# Patient Record
Sex: Female | Born: 1995
Health system: Southern US, Community
[De-identification: ages and names within clinical notes are randomized; demographics above are authoritative.]

## PROBLEM LIST (undated history)

## (undated) DIAGNOSIS — F4310 Post-traumatic stress disorder, unspecified: Secondary | ICD-10-CM

## (undated) DIAGNOSIS — K279 Peptic ulcer, site unspecified, unspecified as acute or chronic, without hemorrhage or perforation: Secondary | ICD-10-CM

## (undated) DIAGNOSIS — N83209 Unspecified ovarian cyst, unspecified side: Secondary | ICD-10-CM

## (undated) DIAGNOSIS — K7290 Hepatic failure, unspecified without coma: Secondary | ICD-10-CM

## (undated) DIAGNOSIS — F32A Depression, unspecified: Secondary | ICD-10-CM

---

## 2014-04-15 ENCOUNTER — Emergency Department (HOSPITAL_BASED_OUTPATIENT_CLINIC_OR_DEPARTMENT_OTHER)
Admission: EM | Admit: 2014-04-15 | Discharge: 2014-04-15 | Disposition: A | Discharge status: Home / Self Care | Payer: Self-pay | Attending: Emergency Medicine | Admitting: Emergency Medicine

## 2014-04-15 ENCOUNTER — Encounter (HOSPITAL_BASED_OUTPATIENT_CLINIC_OR_DEPARTMENT_OTHER): Payer: Self-pay | Admitting: *Deleted

## 2014-04-15 DIAGNOSIS — J020 Streptococcal pharyngitis: Secondary | ICD-10-CM | POA: Insufficient documentation

## 2014-04-15 DIAGNOSIS — Z8711 Personal history of peptic ulcer disease: Secondary | ICD-10-CM | POA: Insufficient documentation

## 2014-04-15 HISTORY — DX: Peptic ulcer, site unspecified, unspecified as acute or chronic, without hemorrhage or perforation: K27.9

## 2014-04-15 LAB — RAPID STREP SCREEN (MED CTR MEBANE ONLY): Streptococcus, Group A Screen (Direct): NEGATIVE

## 2014-04-15 MED ORDER — AMOXICILLIN 500 MG PO CAPS: 500.0000 mg | ORAL_CAPSULE | Freq: Three times a day (TID) | ORAL | Status: DC

## 2014-04-15 NOTE — ED Notes (Signed)
C/o sore throat x 1 week.

## 2014-04-15 NOTE — ED Provider Notes (Signed)
CSN: [id number]     Arrival date & time 04/15/14  1856 History   First MD Initiated Contact with Patient 04/15/14 1925     Chief Complaint  Patient presents with  . Sore Throat     (Consider location/radiation/quality/duration/timing/severity/associated sxs/prior Treatment) HPI Comments: Patient is a 18 year old female who presents with a 4 day history of sore throat. Patient reports gradual onset and progressively worsening sharp, severe throat pain. The pain is constant and made worse with swallowing. The pain is localized to the patient's throat and equal on both sides. Nothing alleviates the pain. The patient has not tried anything for symptom relief. Patient reports associated subjective fever, cervical adenopathy, and non productive cough. Patient denies headache, visual changes, sinus congestion, difficulty breathing, chest pain, SOB, abdominal pain, NVD.     Patient is a 18 y.o. female presenting with pharyngitis.  Sore Throat Associated symptoms include a sore throat.    Past Medical History  Diagnosis Date  . Peptic ulcer    History reviewed. No pertinent past surgical history. No family history on file. History  Substance Use Topics  . Smoking status: Never Smoker   . Smokeless tobacco: Never Used  . Alcohol Use: No   OB History    No data available     Review of Systems  HENT: Positive for sore throat.   All other systems reviewed and are negative.     Allergies  Review of patient's allergies indicates no known allergies.  Home Medications   Prior to Admission medications   Not on File   BP 128/53 mmHg  Pulse 83  Temp(Src) 98.6 F (37 C) (Oral)  Resp 18  Ht 5\' 2"  (1.575 m)  Wt 160 lb (72.576 kg)  BMI 29.26 kg/m2  SpO2 100%  LMP 03/29/2014 Physical Exam  Constitutional: She is oriented to person, place, and time. She appears well-developed and well-nourished. No distress.  HENT:  Head: Normocephalic and atraumatic.  Bilateral tonsillar  edema, erythema, and exudate.   Eyes: Conjunctivae are normal. No scleral icterus.  Neck: Normal range of motion.  Cardiovascular: Normal rate and regular rhythm.  Exam reveals no gallop and no friction rub.   No murmur heard. Pulmonary/Chest: Effort normal and breath sounds normal. She has no wheezes. She has no rales. She exhibits no tenderness.  Abdominal: Soft. She exhibits no distension. There is no tenderness. There is no rebound.  Musculoskeletal: Normal range of motion.  Lymphadenopathy:    She has cervical adenopathy.  Neurological: She is alert and oriented to person, place, and time. Coordination normal.  Speech is goal-oriented. Moves limbs without ataxia.   Skin: Skin is warm and dry.  Psychiatric: She has a normal mood and affect. Her behavior is normal.  Nursing note and vitals reviewed.   ED Course  Procedures (including critical care time) Labs Review Labs Reviewed  RAPID STREP SCREEN  CULTURE, GROUP A STREP    Imaging Review No results found.   EKG Interpretation None      MDM   Final diagnoses:  Strep throat    8:37 PM Patient's rapid strep is negative but clinical appearance consistent with strep. Patient will be treated with amoxicillin. Vitals stable and patient afebrile.    Emilia BeckKaitlyn Laquincy Eastridge, PA-C 04/15/14 2040  Arby BarretteMarcy Pfeiffer, MD 04/16/14 [phone]

## 2014-04-15 NOTE — Discharge Instructions (Signed)
Take amoxicillin as directed until gone. Refer to attached documents for more information.  °

## 2014-04-17 LAB — CULTURE, GROUP A STREP

## 2014-12-14 ENCOUNTER — Encounter (HOSPITAL_BASED_OUTPATIENT_CLINIC_OR_DEPARTMENT_OTHER): Payer: Self-pay

## 2014-12-14 ENCOUNTER — Emergency Department (HOSPITAL_BASED_OUTPATIENT_CLINIC_OR_DEPARTMENT_OTHER)
Admission: EM | Admit: 2014-12-14 | Discharge: 2014-12-14 | Disposition: A | Discharge status: Home / Self Care | Payer: Medicaid Other | Attending: Emergency Medicine | Admitting: Emergency Medicine

## 2014-12-14 DIAGNOSIS — R21 Rash and other nonspecific skin eruption: Secondary | ICD-10-CM | POA: Diagnosis present

## 2014-12-14 DIAGNOSIS — L509 Urticaria, unspecified: Secondary | ICD-10-CM | POA: Diagnosis not present

## 2014-12-14 DIAGNOSIS — Z8711 Personal history of peptic ulcer disease: Secondary | ICD-10-CM | POA: Diagnosis not present

## 2014-12-14 DIAGNOSIS — Z792 Long term (current) use of antibiotics: Secondary | ICD-10-CM | POA: Diagnosis not present

## 2014-12-14 DIAGNOSIS — J029 Acute pharyngitis, unspecified: Secondary | ICD-10-CM | POA: Diagnosis not present

## 2014-12-14 LAB — RAPID STREP SCREEN (MED CTR MEBANE ONLY): Streptococcus, Group A Screen (Direct): NEGATIVE

## 2014-12-14 NOTE — ED Notes (Signed)
PA at bedside.

## 2014-12-14 NOTE — ED Notes (Signed)
Diffuse urticarial rash. Seen previously, given medication, no improvement

## 2014-12-14 NOTE — ED Provider Notes (Signed)
CSN: [id number]     Arrival date & time 12/14/14  1727 History   First MD Initiated Contact with Patient 12/14/14 1748     Chief Complaint  Patient presents with  . Rash     (Consider location/radiation/quality/duration/timing/severity/associated sxs/prior Treatment) HPI   Blood pressure 115/62, pulse 76, temperature 98.7 F (37.1 C), temperature source Oral, resp. rate 18, height  (1.575 m), weight 137 lb (62.143 kg), last menstrual period 12/09/2014, SpO2 99 %.  Susan Beasley is a 19 y.o. female complaining of severe diffuse urticarial rash onset 2 days ago significantly improving with Benadryl and Medrol dose pack. Patient was initially seen at [facility], she had a syncopal event in the ED. Patient reports that the rash is improving and she denies shortness of breath, wheezing, nausea, vomiting, lightheaded sensation, new medications, environmental exposures. She is also reporting a severe sore throat without fever and notes that her daughter has recently had strep. She is specifically requesting a strep test.  Past Medical History  Diagnosis Date  . Peptic ulcer    History reviewed. No pertinent past surgical history. No family history on file. History  Substance Use Topics  . Smoking status: Never Smoker   . Smokeless tobacco: Never Used  . Alcohol Use: No   OB History    No data available     Review of Systems  10 systems reviewed and found to be negative, except as noted in the HPI.   Allergies  Review of patient's allergies indicates no known allergies.  Home Medications   Prior to Admission medications   Medication Sig Start Date End Date Taking? Authorizing Provider  amoxicillin (AMOXIL) 500 MG capsule Take 1 capsule (500 mg total) by mouth 3 (three) times daily. 04/15/14   Kaitlyn Szekalski, PA-C   BP 115/62 mmHg  Pulse 76  Temp(Src) 98.7 F (37.1 C) (Oral)  Resp 18  Ht  (1.575 m)  Wt 137 lb (62.143 kg)  BMI 25.05 kg/m2  SpO2 99%   LMP 12/09/2014 Physical Exam  Constitutional: She is oriented to person, place, and time. She appears well-developed and well-nourished. No distress.  HENT:  Head: Normocephalic and atraumatic.  Mouth/Throat: Oropharynx is clear and moist.  No drooling or stridor. Posterior pharynx mildly erythematous no significant tonsillar hypertrophy. No exudate. Soft palate rises symmetrically. No TTP or induration under tongue.   No tenderness to palpation of frontal or bilateral maxillary sinuses.  No mucosal edema in the nares.  Bilateral tympanic membranes with normal architecture and good light reflex.    Eyes: Conjunctivae and EOM are normal. Pupils are equal, round, and reactive to light.  Neck: Normal range of motion.  Cardiovascular: Normal rate, regular rhythm and intact distal pulses.   Pulmonary/Chest: Effort normal and breath sounds normal. No stridor. No respiratory distress. She has no wheezes. She has no rales. She exhibits no tenderness.  Abdominal: Soft. Bowel sounds are normal. She exhibits no distension and no mass. There is no tenderness. There is no rebound and no guarding.  Musculoskeletal: Normal range of motion.  Neurological: She is alert and oriented to person, place, and time.  Skin: No rash noted.  Psychiatric: She has a normal mood and affect.  Nursing note and vitals reviewed.   ED Course  Procedures (including critical care time) Labs Review Labs Reviewed  RAPID STREP SCREEN (NOT AT [facility])  CULTURE, GROUP A STREP    Imaging Review No results found.   EKG Interpretation None  MDM   Final diagnoses:  Hives  Pharyngitis    Filed Vitals:   12/14/14 1734  BP: 115/62  Pulse: 76  Temp: 98.7 F (37.1 C)  TempSrc: Oral  Resp: 18  Height: 5\' 2"  (1.575 m)  Weight: 137 lb (62.143 kg)  SpO2: 99%     Susan Beasley is a pleasant 19 y.o. female presenting with persistent hives. She's been given Medrol Dosepak and has been taking Benadryl with  excellent symptom relief however she states that when she wakes up in the morning she needs to re-dose the medications. I've explained to her that if she is still being exposed to what ever the allergen is that she will need to continue to take medications. I'll give her referral for allergy testing. Patient has no signs or symptoms consistent with strep however, rapid strep test is ordered at patient's request. No signs of secondary organ involvement, no indication for epinephrine.  Rapid strep negative, allergy testing referral given.  Evaluation does not show pathology that would require ongoing emergent intervention or inpatient treatment. Pt is hemodynamically stable and mentating appropriately. Discussed findings and plan with patient/guardian, who agrees with care plan. All questions answered. Return precautions discussed and outpatient follow up given.     Wynetta Emery, PA-C 12/14/14 1851  Richardean Canal, MD 12/14/14 2351

## 2014-12-14 NOTE — Discharge Instructions (Signed)
1 to 2 tablets of 25 mg Benadryl pills every 4-6 hours as needed to a maximum of 300 mg per day. In addition, you may apply a topical hydrocortisone ointment to all affected areas except for the face.   Do not hesitate to call 911 or return to the emergency room if you develop any shortness of breath, wheezing, tongue or lip swelling.  Do not hesitate to return to the emergency room for any new, worsening or concerning symptoms.  Please obtain primary care using resource guide below. Let them know that you were seen in the emergency room and that they will need to obtain records for further outpatient management.   Hives Hives are itchy, red, swollen areas of the skin. They can vary in size and location on your body. Hives can come and go for hours or several days (acute hives) or for several weeks (chronic hives). Hives do not spread from person to person (noncontagious). They may get worse with scratching, exercise, and emotional stress. CAUSES   Allergic reaction to food, additives, or drugs.  Infections, including the common cold.  Illness, such as vasculitis, lupus, or thyroid disease.  Exposure to sunlight, heat, or cold.  Exercise.  Stress.  Contact with chemicals. SYMPTOMS   Red or white swollen patches on the skin. The patches may change size, shape, and location quickly and repeatedly.  Itching.  Swelling of the hands, feet, and face. This may occur if hives develop deeper in the skin. DIAGNOSIS  Your caregiver can usually tell what is wrong by performing a physical exam. Skin or blood tests may also be done to determine the cause of your hives. In some cases, the cause cannot be determined. TREATMENT  Mild cases usually get better with medicines such as antihistamines. Severe cases may require an emergency epinephrine injection. If the cause of your hives is known, treatment includes avoiding that trigger.  HOME CARE INSTRUCTIONS   Avoid causes that trigger your  hives.  Take antihistamines as directed by your caregiver to reduce the severity of your hives. Non-sedating or low-sedating antihistamines are usually recommended. Do not drive while taking an antihistamine.  Take any other medicines prescribed for itching as directed by your caregiver.  Wear loose-fitting clothing.  Keep all follow-up appointments as directed by your caregiver. SEEK MEDICAL CARE IF:   You have persistent or severe itching that is not relieved with medicine.  You have painful or swollen joints. SEEK IMMEDIATE MEDICAL CARE IF:   You have a fever.  Your tongue or lips are swollen.  You have trouble breathing or swallowing.  You feel tightness in the throat or chest.  You have abdominal pain. These problems may be the first sign of a life-threatening allergic reaction. Call your local emergency services (911 in U.S.). MAKE SURE YOU:   Understand these instructions.  Will watch your condition.  Will get help right away if you are not doing well or get worse. Document Released: 04/23/2005 Document Revised: 04/28/2013 Document Reviewed: 07/17/2011 [facility] Patient Information 2015 [place], [place]. This information is not intended to replace advice given to you by your health care provider. Make sure you discuss any questions you have with your health care provider.   Emergency Department Resource Guide 1) Find a Doctor and Pay Out of Pocket Although you won't have to find out who is covered by your insurance plan, it is a good idea to ask around and get recommendations. You will then need to call the office and  see if the doctor you have chosen will accept you as a new patient and what types of options they offer for patients who are self-pay. Some doctors offer discounts or will set up payment plans for their patients who do not have insurance, but you will need to ask so you aren't surprised when you get to your appointment.  2) Contact Your Local Health  Department Not all health departments have doctors that can see patients for sick visits, but many do, so it is worth a call to see if yours does. If you don't know where your local health department is, you can check in your phone book. The CDC also has a tool to help you locate your state's health department, and many state websites also have listings of all of their local health departments.  3) Find a Walk-in Clinic If your illness is not likely to be very severe or complicated, you may want to try a walk in clinic. These are popping up all over the country in pharmacies, drugstores, and shopping centers. They're usually staffed by nurse practitioners or physician assistants that have been trained to treat common illnesses and complaints. They're usually fairly quick and inexpensive. However, if you have serious medical issues or chronic medical problems, these are probably not your best option.  No Primary Care Doctor: - Call Health Connect at  [phone] - they can help you locate a primary care doctor that  accepts your insurance, provides certain services, etc. - Physician Referral Service- [phone]  Chronic Pain Problems: Organization         Address  Phone   Notes  [facility]  [phone] Patients need to be referred by their primary care doctor.   Medication Assistance: Organization         Address  Phone   Notes  [facility] Medication [facility] [address] [place], [place] [address] [phone] --Must be a resident of [facility] -- Must have NO insurance coverage whatsoever (no Medicaid/ Medicare, etc.) -- The pt. MUST have a primary care doctor that directs their care regularly and follows them in the community   [facility]ist  [phone]   [employer]  [phone]    Agencies that provide inexpensive medical care: Organization         Address  Phone   Notes  [facility]  [phone]   [facility]    [phone]   [facility] [address] [place], [place] [address] [phone]   [facility] [address] [place], [place] [phone]   Planned Parenthood    [phone]   [facility]    [phone]   [facility]  [address], [place] Phone:  [phone], Fax:  [phone] Hours of Operation:  9 am - 6 pm, M-F.  Also accepts Medicaid/Medicare and self-pay.  [facility]  [address], [place] Phone: [phone], Fax: [phone]. Hours of Operation:  8:30 am - 5:30 pm, M-F.  Also accepts Medicaid and self-pay.  [facility] [place] [address], [place] Phone: [phone]   [facility] [address] Natasha Bence [place], [place] [phone], Ext. [phone] Mondays & Thursdays: 7-9 AM.  First 15 patients are seen on a first come, first serve basis.    Medicaid-accepting [facility]  Providers:  Organization         Address  Phone   Notes  [facility] [address], [place] [phone] Also accepts self-pay patients.  [facility] [address] Laurell Josephs [place], [place]  [phone]   [facility] [address], [place] [phone]   [facility] [address], [place] [phone]   Renaye Rakers [address], [place]   [phone] Only accepts [place] Access [place] patients after they have their name applied to their card.   Self-Pay (no insurance) in [facility]:  Organization         Address  Phone   Notes  Sickle Cell Patients, [facility] [address] [place], [place] [phone]   [facility] [address] [place], [place] [phone]   [facility] [place]  [address],  [place] [phone]   [facility]/Dr. Osei-Bonsu  [address] [place] or [address], [place] [phone] Phone number for both [place] and [place] locations is the same.  [facility] [address], [place] [phone]   [facility] [address], [place] or [address] [phone]   [facility] [address], [place] [phone], phone; [phone], fax Sees patients 1st and 3rd Saturday of every month.  Must not qualify for public or private insurance (i.e. Medicaid, Medicare, [place] Health Choice, Veterans' Benefits)  Household income should be no more than 200% of the poverty level The clinic cannot treat you if you are pregnant or think you are pregnant  Sexually transmitted diseases are not treated at the clinic.    Dental Care: Organization         Address  Phone  Notes  [facility] Department of [facility] [address] [place], [place] [phone] Accepts children up to age 72 who are enrolled in [place] or [place] Health Choice; pregnant women with a Medicaid card; and children who have applied for Medicaid or [place] Health Choice, but were declined, whose parents can pay a reduced fee at time of service.  [facility] Department of [facility]  [address], [place] [phone] Accepts children up to age 15 who are enrolled in [place] or [place] Health Choice; pregnant women with a Medicaid card; and children who have applied for Medicaid or [place] Health Choice, but were declined, whose parents can pay a reduced fee at time of service.  [facility]  [address] [place], [place] [phone] Patients are seen by appointment only. Walk-ins are not accepted. [facility] will see patients 9 years of age and older. Monday - Tuesday (8am-5pm) Most Wednesdays  (8:30-5pm) $30 per visit, cash only  [facility]  [address], [facility] [phone] Patients are seen by appointment only. Walk-ins are not accepted. [facility] will see patients 3 years of age and older. One Wednesday Evening (Monthly: Volunteer Based).  $30 per visit, cash only  [employer]  [phone] for adults; Children under age 81, call Graduate Pediatric Dentistry at [phone]. Children aged 8-14, please call [phone] to request a pediatric application.  Dental services are provided in all areas of dental care including fillings, crowns and bridges, complete and partial dentures, implants, gum treatment, root canals, and extractions. Preventive care is also provided. Treatment is provided to both adults and children. Patients are selected via a lottery and there is often a waiting list.   [facility] [address] [place]  [phone] [email or web address]   [facility] [address] [place], [place] [phone], Ext. [phone] Second and Fourth Thursday of each month, opens at 6:30 AM; Clinic ends at 9 AM.  Patients are seen on a first-come first-served basis, and a limited number are seen during each clinic.   [facility]  [address] Ether Griffins [place], [place] [phone]   Eligibility Requirements You must have lived in [place], [place], or [place] counties for at least the last three months.   You cannot be eligible for state or federal sponsored [place], including [employer], [place], or [employer].   You generally cannot be eligible for healthcare insurance through your employer.    How to apply: Eligibility screenings are held every Tuesday and Wednesday afternoon from 1:00 pm until 4:00 pm. You do not need an appointment for the interview!  [facility] [address], [place], [place] [phone]   [facility]  Health Department  [phone]   [facility] Health Department  [phone]   [facility] Health Department  [phone]    Behavioral Health Resources in the Community: Intensive Outpatient Programs Organization         Address  Phone  Notes  [facility] [address], [place], [place] [phone]   [facility] [address] [place], [place] [phone]   [facility] [address] [place], [place]  [phone]   [facility] [address]  [place], [place] [phone] or [phone]   Substance Abuse Resources Organization         Address  Phone  Notes  [facility]  [phone]   [facility]  [phone]   [facility] [place]  [phone]   Floydene Flock  [phone]   Residential & Outpatient Substance Abuse Program  [phone]   Psychological Services Organization         Address  Phone  Notes  [facility]  [phone]   [facility]  [phone]   [facility] [address] [place] [phone] or [phone]    Mobile Crisis Teams Organization         Address  Phone  Notes  [facility], Mobile Crisis Care Unit  [phone]   [facility]  [address] [place], [place] [phone]   Doristine Locks [address] [place] [phone]    Self-Help/Support Groups Organization         Address  Phone             Notes  [facility] [place] - variety of support groups  336- I7437963 Call for more information  Narcotics Anonymous (NA), [facility] [address], [employer] [place]  2 meetings at this location   Statistician         Address  Phone  Notes  [facility] [address],    [place]  [phone]   [facility]  [address] [place] [address], [place], [place]  [phone]   [facility] [address] [place], [place] [phone] Admissions: 8am-3pm M-F  [facility] [address]    [place], [place] [phone]   [facility] [address] [place], [place], [place] [phone]   [facility] [address],  [place], [place] [phone]   [facility] - Intensive Outpatient [address] Laurell Joseph[address], [place], [place] [phone]   [facility] ([facility] [address]  [place], [place] [phone] or [phone]   [facility] ([facility]) [address] [place], [place] [phone] Accepts Medicaid  [facility] [address]  [place] [phone] Substance Abuse/Addiction Treatment   [facility] Organization         Address  Phone  Notes  [facility]  [phone]   Angie Fava, PhD [address] Ervin Knack [place], [place]   [phone] or [phone]   [facility] Behavioral   [address] [place], [place] [phone]   [facility] [address], [place], [place] [phone] Insurance/Medicaid/sponsorship through [facility] and Families [address]                                    [place], [place] [phone] Therapy/tele-psych/case  [facility] [address], [place] [phone]    Dr. Lolly Mustache  [phone]   [facility]  [employer] [facility] Dept. 1) [address] [place] 2) [address] [place] 3)  [address], [place] [phone]  [phone]   [facility] Child Abuse Hotline 845-289-5611 or [phone] (After Hours)

## 2014-12-16 LAB — CULTURE, GROUP A STREP: STREP A CULTURE: NEGATIVE

## 2015-01-21 ENCOUNTER — Encounter (HOSPITAL_COMMUNITY): Payer: Self-pay

## 2015-01-21 ENCOUNTER — Emergency Department (HOSPITAL_COMMUNITY)
Admission: EM | Admit: 2015-01-21 | Discharge: 2015-01-21 | Disposition: A | Discharge status: Home / Self Care | Payer: Medicaid Other | Attending: Emergency Medicine | Admitting: Emergency Medicine

## 2015-01-21 DIAGNOSIS — S0993XA Unspecified injury of face, initial encounter: Secondary | ICD-10-CM | POA: Diagnosis present

## 2015-01-21 DIAGNOSIS — T07XXXA Unspecified multiple injuries, initial encounter: Secondary | ICD-10-CM

## 2015-01-21 DIAGNOSIS — Z8711 Personal history of peptic ulcer disease: Secondary | ICD-10-CM | POA: Diagnosis not present

## 2015-01-21 DIAGNOSIS — S0001XA Abrasion of scalp, initial encounter: Secondary | ICD-10-CM | POA: Insufficient documentation

## 2015-01-21 DIAGNOSIS — Y9289 Other specified places as the place of occurrence of the external cause: Secondary | ICD-10-CM | POA: Diagnosis not present

## 2015-01-21 DIAGNOSIS — Y9389 Activity, other specified: Secondary | ICD-10-CM | POA: Diagnosis not present

## 2015-01-21 DIAGNOSIS — Y998 Other external cause status: Secondary | ICD-10-CM | POA: Diagnosis not present

## 2015-01-21 MED ORDER — BACITRACIN ZINC 500 UNIT/GM EX OINT: TOPICAL_OINTMENT | Freq: Two times a day (BID) | CUTANEOUS | Status: DC
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
## 2015-01-21 NOTE — Discharge Instructions (Signed)
Abrasions An abrasion is a cut or scrape of the skin. Abrasions do not go through all layers of the skin. HOME CARE  If a bandage (dressing) was put on your wound, change it as told by your doctor. If the bandage sticks, soak it off with warm.  Wash the area with water and soap 2 times a day. Rinse off the soap. Pat the area dry with a clean towel.  Put on medicated cream (ointment) as told by your doctor.  Change your bandage right away if it gets wet or dirty.  Only take medicine as told by your doctor.  See your doctor within 24-48 hours to get your wound checked.  Check your wound for redness, puffiness (swelling), or yellowish-white fluid (pus). GET HELP RIGHT AWAY IF:   You have more pain in the wound.  You have redness, swelling, or tenderness around the wound.  You have pus coming from the wound.  You have a fever or lasting symptoms for more than 2-3 days.  You have a fever and your symptoms suddenly get worse.  You have a bad smell coming from the wound or bandage. MAKE SURE YOU:   Understand these instructions.  Will watch your condition.  Will get help right away if you are not doing well or get worse. Document Released: 10/10/2007 Document Revised: 01/16/2012 Document Reviewed: 03/27/2011 [facility] Patient Information 2015 [place], [place]. This information is not intended to replace advice given to you by your health care provider. Make sure you discuss any questions you have with your health care provider. Apply a small amount of antibiotic-coated ointment to the abrasions until scab is formed

## 2015-12-30 ENCOUNTER — Encounter (HOSPITAL_BASED_OUTPATIENT_CLINIC_OR_DEPARTMENT_OTHER): Payer: Self-pay | Admitting: Emergency Medicine

## 2015-12-30 DIAGNOSIS — Z5321 Procedure and treatment not carried out due to patient leaving prior to being seen by health care provider: Secondary | ICD-10-CM | POA: Insufficient documentation

## 2015-12-30 DIAGNOSIS — R51 Headache: Secondary | ICD-10-CM | POA: Insufficient documentation

## 2015-12-30 DIAGNOSIS — Z792 Long term (current) use of antibiotics: Secondary | ICD-10-CM | POA: Insufficient documentation

## 2015-12-30 NOTE — ED Triage Notes (Signed)
Patient states that for that last month she has had a Headache behind her eyes and to the back of her head. The patient states that she came in tonight because it is now "going down into my chest" Denies any Nausea or Vomiting.

## 2015-12-31 ENCOUNTER — Emergency Department (HOSPITAL_BASED_OUTPATIENT_CLINIC_OR_DEPARTMENT_OTHER)
Admission: EM | Admit: 2015-12-31 | Discharge: 2015-12-31 | Disposition: A | Discharge status: Home / Self Care | Payer: Medicaid Other | Attending: Dermatology | Admitting: Dermatology

## 2015-12-31 NOTE — ED Notes (Signed)
Per registration pt left

## 2016-01-12 ENCOUNTER — Emergency Department (HOSPITAL_BASED_OUTPATIENT_CLINIC_OR_DEPARTMENT_OTHER)
Admission: EM | Admit: 2016-01-12 | Discharge: 2016-01-13 | Disposition: A | Discharge status: Home / Self Care | Payer: Self-pay | Attending: Emergency Medicine | Admitting: Emergency Medicine

## 2016-01-12 ENCOUNTER — Emergency Department (HOSPITAL_BASED_OUTPATIENT_CLINIC_OR_DEPARTMENT_OTHER): Payer: Self-pay

## 2016-01-12 ENCOUNTER — Encounter (HOSPITAL_BASED_OUTPATIENT_CLINIC_OR_DEPARTMENT_OTHER): Payer: Self-pay

## 2016-01-12 DIAGNOSIS — R509 Fever, unspecified: Secondary | ICD-10-CM | POA: Insufficient documentation

## 2016-01-12 DIAGNOSIS — R51 Headache: Secondary | ICD-10-CM | POA: Insufficient documentation

## 2016-01-12 DIAGNOSIS — M542 Cervicalgia: Secondary | ICD-10-CM | POA: Insufficient documentation

## 2016-01-12 DIAGNOSIS — G8929 Other chronic pain: Secondary | ICD-10-CM

## 2016-01-12 HISTORY — DX: Unspecified ovarian cyst, unspecified side: N83.209

## 2016-01-12 MED ORDER — DIPHENHYDRAMINE HCL 50 MG/ML IJ SOLN
25.0000 mg | Freq: Once | INTRAMUSCULAR | Status: AC
  Administered 2016-01-12: 25 mg via INTRAVENOUS
  Filled 2016-01-12: qty 1

## 2016-01-12 MED ORDER — SODIUM CHLORIDE 0.9 % IV BOLUS (SEPSIS)
1000.0000 mL | Freq: Once | INTRAVENOUS | Status: AC
  Administered 2016-01-12: 1000 mL via INTRAVENOUS

## 2016-01-12 MED ORDER — PROCHLORPERAZINE EDISYLATE 5 MG/ML IJ SOLN
10.0000 mg | Freq: Once | INTRAMUSCULAR | Status: AC
  Administered 2016-01-12: 10 mg via INTRAVENOUS
  Filled 2016-01-12: qty 2

## 2016-01-12 NOTE — ED Triage Notes (Addendum)
Pt c/o intermittent head pain with photosensitivity for the last month.  She has associated neck and shoulder pain with the head pain.  Denies n/v, states she had a fever two weeks ago.  Has not tried any medication recently for pain.

## 2016-01-12 NOTE — ED Provider Notes (Signed)
[facility] Provider Note   CSN: [id number] Arrival date & time: 01/12/16  2310   By signing my name below, I, [name], attest that this documentation has been prepared under the direction and in the presence of Shon Baton, MD . Electronically Signed: [name], Scribe. 01/12/2016. 11:51 PM.  History   Chief Complaint Chief Complaint  Patient presents with  . Headache   The history is provided by the patient and medical records. No language interpreter was used.    HPI Comments:  Harmoney Beasley is a 20 y.o. female who presents to the Emergency Department complaining of constant headaches which began at the start of August.  She woke up one morning with a "crick" in her neck that didn't go away.  This progressed to chills, cold symptoms, a fever of 101, a headache, and neck pain.  She reports isolated fever 3 days after the start of her headache. She has not had any subsequent fevers. She reports headache begins at the top of her head and radiates to her neck and shoulders.  Her pain is always present, but waxing and waning--tonight she describes it as 10/10.  She has no history of severe headache and has never had a headache like this before.  She is sensitive to light and states that her pain is exacerbated by standing up quickly and bending over stating it "feels like everything is going to fall out of her head."  She adds that her neck pain is worse when she turns her head, but denies stiffness.  She denies numbness, tingling, vision changes, sore throat, and vomiting.  She also denies taking estrogen, birth control, and regular medications.  She has no known allergies.     Past Medical History:  Diagnosis Date  . Ovarian cyst   . Peptic ulcer     There are no active problems to display for this patient.   History reviewed. No pertinent surgical history.  OB History    No data available       Home Medications    Prior to Admission  medications   Medication Sig Start Date End Date Taking? Authorizing Provider  amoxicillin (AMOXIL) 500 MG capsule Take 1 capsule (500 mg total) by mouth 3 (three) times daily. Patient not taking: Reported on 01/21/2015 04/15/14   Emilia Beck, PA-C  cyclobenzaprine (FLEXERIL) 10 MG tablet Take 1 tablet (10 mg total) by mouth 2 (two) times daily as needed for muscle spasms. 01/13/16   Shon Baton, MD    Family History No family history on file.  Social History Social History  Substance Use Topics  . Smoking status: Never Smoker  . Smokeless tobacco: Never Used  . Alcohol use No     Allergies   Review of patient's allergies indicates no known allergies.   Review of Systems Review of Systems  Constitutional: Positive for chills and fever.  HENT: Negative for sore throat.   Eyes: Negative for visual disturbance.  Gastrointestinal: Negative for vomiting.  Musculoskeletal: Positive for neck pain.  Neurological: Positive for headaches. Negative for dizziness, weakness and numbness.  All other systems reviewed and are negative.    Physical Exam Updated Vital Signs BP 111/65 (BP Location: Left Arm)   Pulse 87   Temp 98.3 F (36.8 C) (Oral)   Resp 18   Ht 5\' 2"  (1.575 m)   Wt 130 lb (59 kg)   LMP 01/07/2016   SpO2 100%   BMI 23.78 kg/m  Physical Exam  Constitutional: She is oriented to person, place, and time. She appears well-developed and well-nourished. No distress.  HENT:  Head: Normocephalic and atraumatic.  Eyes: EOM are normal. Pupils are equal, round, and reactive to light.  Neck: Normal range of motion. Neck supple.  No meningismus, tenderness to palpation bilateral cervical paraspinous muscle region and trapezius muscles  Cardiovascular: Normal rate, regular rhythm and normal heart sounds.   Pulmonary/Chest: Effort normal and breath sounds normal. No respiratory distress. She has no wheezes.  Abdominal: Soft. Bowel sounds are normal.  Neurological:  She is alert and oriented to person, place, and time.  Cranial nerves II through XII intact, 5 out of 5 strength in all 4 extremities, no dysmetria to finger-nose-finger  Skin: Skin is warm and dry.  Psychiatric: She has a normal mood and affect.  Nursing note and vitals reviewed.    ED Treatments / Results   DIAGNOSTIC STUDIES:  Oxygen Saturation is 100% on RA, NML by my interpretation.    COORDINATION OF CARE:  11:52 PM Discussed treatment plan with pt at bedside and pt agreed to plan.  Labs (all labs ordered are listed, but only abnormal results are displayed) Labs Reviewed - No data to display  EKG  EKG Interpretation None       Radiology Ct Head Wo Contrast  Result Date: 01/13/2016 CLINICAL DATA:  Headache and photosensitivity EXAM: CT HEAD WITHOUT CONTRAST TECHNIQUE: Contiguous axial images were obtained from the base of the skull through the vertex without intravenous contrast. COMPARISON:  None. FINDINGS: Brain: No mass lesion, intraparenchymal hemorrhage or extra-axial collection. No evidence of acute cortical infarct. Brain parenchyma and CSF-containing spaces are normal for age. There is no expansion or abnormal hyperdensity of the visible dural venous sinuses. No colloid cyst is identified. Normal position of the cerebellar tonsils. Vascular: No hyperdense vessel or atherosclerotic calcification. Skull: Normal visualized skull base, calvarium and extracranial soft tissues. Sinuses/Orbits: No sinus fluid levels or advanced mucosal thickening. No mastoid effusion. Normal orbits. IMPRESSION: Normal head CT. Electronically Signed   By: Deatra Robinson M.D.   On: 01/13/2016 00:17    Procedures Procedures (including critical care time)  Medications Ordered in ED Medications  sodium chloride 0.9 % bolus 1,000 mL (1,000 mLs Intravenous New Bag/Given 01/12/16 2357)  prochlorperazine (COMPAZINE) injection 10 mg (10 mg Intravenous Given 01/12/16 2358)  diphenhydrAMINE (BENADRYL)  injection 25 mg (25 mg Intravenous Given 01/12/16 2357)  ketorolac (TORADOL) 30 MG/ML injection 15 mg (15 mg Intravenous Given 01/13/16 0030)     Initial Impression / Assessment and Plan / ED Course  I have reviewed the triage vital signs and the nursing notes.  Pertinent labs & imaging results that were available during my care of the patient were reviewed by me and considered in my medical decision making (see chart for details).  Clinical Course    Patient presents with a headache.  Has been chronic over last month. Reports early on that she had a fever but has not had a fever in many weeks. Also reports some neck pain but no stiffness. She is afebrile. Nontoxic. Nonfocal neurologic exam. She does report that the headache is worse with bending over which can be a concerning feature. No meningismus on exam. Patient was given a migraine cocktail. CT head negative for mass lesion. She also has reproducible tenderness over the muscles of her cervical and upper back. On recheck, patient reports improvement of her headache. Will discharge her Flexeril and have her follow-up  with neurology if the headaches persist.  After history, exam, and medical workup I feel the patient has been appropriately medically screened and is safe for discharge home. Pertinent diagnoses were discussed with the patient. Patient was given return precautions.   Final Clinical Impressions(s) / ED Diagnoses   Final diagnoses:  Chronic nonintractable headache, unspecified headache type  Muscle pain, cervical    New Prescriptions New Prescriptions   CYCLOBENZAPRINE (FLEXERIL) 10 MG TABLET    Take 1 tablet (10 mg total) by mouth 2 (two) times daily as needed for muscle spasms.   I personally performed the services described in this documentation, which was scribed in my presence. The recorded information has been reviewed and is accurate.     Shon Batonourtney F Horton, MD 01/13/16 [phone]

## 2016-01-13 MED ORDER — KETOROLAC TROMETHAMINE 30 MG/ML IJ SOLN
15.0000 mg | Freq: Once | INTRAMUSCULAR | Status: AC
  Administered 2016-01-13: 15 mg via INTRAVENOUS
  Filled 2016-01-13: qty 1

## 2016-01-13 MED ORDER — CYCLOBENZAPRINE HCL 10 MG PO TABS: 10.0000 mg | ORAL_TABLET | Freq: Two times a day (BID) | ORAL | 0 refills | Status: DC | PRN

## 2017-03-21 ENCOUNTER — Other Ambulatory Visit: Payer: Self-pay

## 2017-03-21 ENCOUNTER — Encounter (HOSPITAL_BASED_OUTPATIENT_CLINIC_OR_DEPARTMENT_OTHER): Payer: Self-pay | Admitting: Emergency Medicine

## 2017-03-21 ENCOUNTER — Emergency Department (HOSPITAL_BASED_OUTPATIENT_CLINIC_OR_DEPARTMENT_OTHER)
Admission: EM | Admit: 2017-03-21 | Discharge: 2017-03-21 | Disposition: A | Discharge status: Home / Self Care | Payer: Self-pay | Attending: Emergency Medicine | Admitting: Emergency Medicine

## 2017-03-21 DIAGNOSIS — K0889 Other specified disorders of teeth and supporting structures: Secondary | ICD-10-CM | POA: Insufficient documentation

## 2017-03-21 DIAGNOSIS — Z5321 Procedure and treatment not carried out due to patient leaving prior to being seen by health care provider: Secondary | ICD-10-CM | POA: Insufficient documentation

## 2017-03-21 NOTE — ED Triage Notes (Signed)
Patient states that she has a broken tooth at the back of her mouth. She reports that she feels like it is infected now

## 2017-03-21 NOTE — ED Notes (Signed)
No answer

## 2017-07-07 ENCOUNTER — Other Ambulatory Visit: Payer: Self-pay

## 2017-07-07 ENCOUNTER — Emergency Department (HOSPITAL_BASED_OUTPATIENT_CLINIC_OR_DEPARTMENT_OTHER): Payer: Self-pay

## 2017-07-07 ENCOUNTER — Inpatient Hospital Stay (HOSPITAL_BASED_OUTPATIENT_CLINIC_OR_DEPARTMENT_OTHER)
Admission: EM | Admit: 2017-07-07 | Discharge: 2017-07-10 | DRG: 918 | Disposition: A | Discharge status: Home / Self Care | Payer: Self-pay | Attending: Family Medicine | Admitting: Family Medicine

## 2017-07-07 ENCOUNTER — Encounter (HOSPITAL_BASED_OUTPATIENT_CLINIC_OR_DEPARTMENT_OTHER): Payer: Self-pay | Admitting: Adult Health

## 2017-07-07 DIAGNOSIS — R945 Abnormal results of liver function studies: Secondary | ICD-10-CM

## 2017-07-07 DIAGNOSIS — E876 Hypokalemia: Secondary | ICD-10-CM | POA: Diagnosis present

## 2017-07-07 DIAGNOSIS — K047 Periapical abscess without sinus: Secondary | ICD-10-CM

## 2017-07-07 DIAGNOSIS — T391X1A Poisoning by 4-Aminophenol derivatives, accidental (unintentional), initial encounter: Principal | ICD-10-CM

## 2017-07-07 DIAGNOSIS — R1011 Right upper quadrant pain: Secondary | ICD-10-CM

## 2017-07-07 DIAGNOSIS — R740 Nonspecific elevation of levels of transaminase and lactic acid dehydrogenase [LDH]: Secondary | ICD-10-CM | POA: Diagnosis present

## 2017-07-07 DIAGNOSIS — D72829 Elevated white blood cell count, unspecified: Secondary | ICD-10-CM | POA: Diagnosis present

## 2017-07-07 DIAGNOSIS — E872 Acidosis: Secondary | ICD-10-CM | POA: Diagnosis present

## 2017-07-07 DIAGNOSIS — R112 Nausea with vomiting, unspecified: Secondary | ICD-10-CM | POA: Diagnosis present

## 2017-07-07 DIAGNOSIS — R7989 Other specified abnormal findings of blood chemistry: Secondary | ICD-10-CM

## 2017-07-07 DIAGNOSIS — K5090 Crohn's disease, unspecified, without complications: Secondary | ICD-10-CM | POA: Diagnosis present

## 2017-07-07 DIAGNOSIS — R8290 Unspecified abnormal findings in urine: Secondary | ICD-10-CM | POA: Diagnosis present

## 2017-07-07 DIAGNOSIS — K279 Peptic ulcer, site unspecified, unspecified as acute or chronic, without hemorrhage or perforation: Secondary | ICD-10-CM | POA: Diagnosis present

## 2017-07-07 DIAGNOSIS — T50901A Poisoning by unspecified drugs, medicaments and biological substances, accidental (unintentional), initial encounter: Secondary | ICD-10-CM | POA: Diagnosis present

## 2017-07-07 DIAGNOSIS — R9431 Abnormal electrocardiogram [ECG] [EKG]: Secondary | ICD-10-CM | POA: Diagnosis present

## 2017-07-07 LAB — COMPREHENSIVE METABOLIC PANEL
ALT: 188 U/L — ABNORMAL HIGH (ref 14–54)
AST: 153 U/L — ABNORMAL HIGH (ref 15–41)
Albumin: 5.1 g/dL — ABNORMAL HIGH (ref 3.5–5.0)
Alkaline Phosphatase: 67 U/L (ref 38–126)
Anion gap: 14 (ref 5–15)
BUN: 20 mg/dL (ref 6–20)
CALCIUM: 9.8 mg/dL (ref 8.9–10.3)
CO2: 20 mmol/L — ABNORMAL LOW (ref 22–32)
Chloride: 105 mmol/L (ref 101–111)
Creatinine, Ser: 0.77 mg/dL (ref 0.44–1.00)
GFR calc Af Amer: >60 mL/min (ref >60)
GFR calc non Af Amer: >60 mL/min (ref >60)
Glucose, Bld: 119 mg/dL — ABNORMAL HIGH (ref 65–99)
POTASSIUM: 3.7 mmol/L (ref 3.5–5.1)
Sodium: 139 mmol/L (ref 135–145)
Total Bilirubin: 2.3 mg/dL — ABNORMAL HIGH (ref 0.3–1.2)
Total Protein: 8.7 g/dL — ABNORMAL HIGH (ref 6.5–8.1)

## 2017-07-07 LAB — URINALYSIS, ROUTINE W REFLEX MICROSCOPIC
Glucose, UA: NEGATIVE mg/dL
Hgb urine dipstick: NEGATIVE
LEUKOCYTES UA: NEGATIVE
Nitrite: NEGATIVE
PROTEIN: 30 mg/dL — AB
Specific Gravity, Urine: 1.025 (ref 1.005–1.030)
pH: 6 (ref 5.0–8.0)

## 2017-07-07 LAB — APTT: aPTT: 31 seconds (ref 24–36)

## 2017-07-07 LAB — LIPASE, BLOOD: Lipase: 21 U/L (ref 11–51)

## 2017-07-07 LAB — ACETAMINOPHEN LEVEL: Acetaminophen (Tylenol), Serum: <10 ug/mL — ABNORMAL LOW (ref 10–30)

## 2017-07-07 LAB — CBC
HEMATOCRIT: 43.7 % (ref 36.0–46.0)
Hemoglobin: 15.5 g/dL — ABNORMAL HIGH (ref 12.0–15.0)
MCH: 32 pg (ref 26.0–34.0)
MCHC: 35.5 g/dL (ref 30.0–36.0)
MCV: 90.1 fL (ref 78.0–100.0)
Platelets: 251 10*3/uL (ref 150–400)
RBC: 4.85 MIL/uL (ref 3.87–5.11)
RDW: 11.6 % (ref 11.5–15.5)
WBC: 16.9 10*3/uL — AB (ref 4.0–10.5)

## 2017-07-07 LAB — URINALYSIS, MICROSCOPIC (REFLEX)

## 2017-07-07 LAB — PROTIME-INR
INR: 1.58
Prothrombin Time: 18.7 seconds — ABNORMAL HIGH (ref 11.4–15.2)

## 2017-07-07 LAB — PREGNANCY, URINE: Preg Test, Ur: NEGATIVE

## 2017-07-07 LAB — SALICYLATE LEVEL: Salicylate Lvl: <7 mg/dL (ref 2.8–30.0)

## 2017-07-07 MED ORDER — ONDANSETRON HCL 4 MG/2ML IJ SOLN
INTRAMUSCULAR | Status: AC
  Filled 2017-07-07: qty 2

## 2017-07-07 MED ORDER — DEXTROSE 5 % IV SOLN: 15.0000 mg/kg/h | INTRAVENOUS | Status: DC

## 2017-07-07 MED ORDER — SODIUM CHLORIDE 0.9 % IV BOLUS (SEPSIS)
1000.0000 mL | Freq: Once | INTRAVENOUS | Status: AC
  Administered 2017-07-07: 1000 mL via INTRAVENOUS

## 2017-07-07 MED ORDER — INFLUENZA VAC SPLIT QUAD 0.5 ML IM SUSY
0.5000 mL | PREFILLED_SYRINGE | INTRAMUSCULAR | Status: DC
  Filled 2017-07-07: qty 0.5

## 2017-07-07 MED ORDER — PANTOPRAZOLE SODIUM 40 MG IV SOLR
40.0000 mg | Freq: Every day | INTRAVENOUS | Status: DC
  Administered 2017-07-08: 40 mg via INTRAVENOUS
  Filled 2017-07-07: qty 40

## 2017-07-07 MED ORDER — DEXTROSE-NACL 5-0.9 % IV SOLN
INTRAVENOUS | Status: DC
  Administered 2017-07-07 (×2): via INTRAVENOUS

## 2017-07-07 MED ORDER — PROMETHAZINE HCL 25 MG/ML IJ SOLN
12.5000 mg | Freq: Four times a day (QID) | INTRAMUSCULAR | Status: DC | PRN
  Administered 2017-07-07 – 2017-07-08 (×2): 12.5 mg via INTRAVENOUS
  Filled 2017-07-07: qty 1

## 2017-07-07 MED ORDER — ACETYLCYSTEINE LOAD VIA INFUSION: 150.0000 mg/kg | Freq: Once | INTRAVENOUS | Status: DC

## 2017-07-07 MED ORDER — ACETYLCYSTEINE 200 MG/ML IV SOLN
INTRAVENOUS | Status: AC
Start: 2017-07-07 — End: 2017-07-08
  Filled 2017-07-07: qty 30

## 2017-07-07 MED ORDER — PROCHLORPERAZINE EDISYLATE 5 MG/ML IJ SOLN
10.0000 mg | Freq: Four times a day (QID) | INTRAMUSCULAR | Status: DC | PRN
  Administered 2017-07-07: 10 mg via INTRAVENOUS
  Filled 2017-07-07: qty 2

## 2017-07-07 MED ORDER — ACETYLCYSTEINE 200 MG/ML IV SOLN
15.0000 mg/kg/h | INTRAVENOUS | Status: DC
Start: 2017-07-07 — End: 2017-07-08
  Administered 2017-07-07: 15 mg/kg/h via INTRAVENOUS
  Filled 2017-07-07 (×2): qty 100

## 2017-07-07 MED ORDER — ONDANSETRON HCL 4 MG/2ML IJ SOLN: 4.0000 mg | Freq: Once | INTRAMUSCULAR | Status: DC

## 2017-07-07 MED ORDER — ACETYLCYSTEINE LOAD VIA INFUSION
150.0000 mg/kg | Freq: Once | INTRAVENOUS | Status: AC
  Administered 2017-07-07: 8160 mg via INTRAVENOUS
  Filled 2017-07-07: qty 204

## 2017-07-07 MED ORDER — PROMETHAZINE HCL 25 MG/ML IJ SOLN
6.2500 mg | Freq: Four times a day (QID) | INTRAMUSCULAR | Status: DC | PRN
  Filled 2017-07-07 (×2): qty 1

## 2017-07-07 MED ORDER — ONDANSETRON HCL 4 MG/2ML IJ SOLN
4.0000 mg | Freq: Once | INTRAMUSCULAR | Status: AC | PRN
  Administered 2017-07-07: 4 mg via INTRAVENOUS
  Filled 2017-07-07: qty 2

## 2017-07-07 MED ORDER — MORPHINE SULFATE (PF) 4 MG/ML IV SOLN: 1.0000 mg | INTRAVENOUS | Status: DC | PRN

## 2017-07-07 MED ORDER — DEXTROSE 5 % IV SOLN
15.0000 mg/kg/h | INTRAVENOUS | Status: DC
  Filled 2017-07-07: qty 150

## 2017-07-07 MED ORDER — METOCLOPRAMIDE HCL 5 MG/ML IJ SOLN: 10.0000 mg | Freq: Four times a day (QID) | INTRAMUSCULAR | Status: DC | PRN

## 2017-07-07 MED ORDER — PANTOPRAZOLE SODIUM 40 MG IV SOLR
40.0000 mg | Freq: Once | INTRAVENOUS | Status: AC
  Administered 2017-07-07: 40 mg via INTRAVENOUS
  Filled 2017-07-07: qty 40

## 2017-07-07 MED ORDER — ACETYLCYSTEINE 200 MG/ML IV SOLN
INTRAVENOUS | Status: AC
  Filled 2017-07-07: qty 30

## 2017-07-07 MED ORDER — ONDANSETRON HCL 4 MG/2ML IJ SOLN
4.0000 mg | Freq: Four times a day (QID) | INTRAMUSCULAR | Status: DC | PRN
  Administered 2017-07-07: 4 mg via INTRAVENOUS
  Filled 2017-07-07: qty 2

## 2017-07-07 NOTE — ED Notes (Signed)
Informed patient that MD stated that she can not have anything to eat but ice chips.  Patient verbalized understanding.

## 2017-07-07 NOTE — ED Notes (Signed)
Attempted t call report to floor nurse.

## 2017-07-07 NOTE — Progress Notes (Signed)
Pt at [facility], with nausea and vomiting, diarrhea for several days, took tylenol. Here in ED with elevated LFT's. MD in ED spoke with poison control, asked to admit for further eval and management for tylenol OD. Requested admission to tele unit.   Debbora Presto, MD  [facility] Pager [phone]  If 7PM-7AM, please contact night-coverage [email or web address] Password [email or web address]

## 2017-07-07 NOTE — H&P (Signed)
History and Physical    Susan Beasley ZOX:[id number] DOB: 09/13/1995 DOA: 07/07/2017  PCP: Patient, No Pcp Per  Patient coming from: home  I have personally briefly reviewed patient's old medical records in [facility]  Chief Complaint: nausea and vomiting  HPI: Susan Beasley is Susan Beasley 22 y.o. female with medical history significant of post traumatic stress, anxiety, depression, gastric ulcers, and history of possible Crohn's as well who presents with nausea, vomiting, abdominal pain and elevated liver enzyme test in the setting of suspected accidental tylenol overdose.  Susan Beasley notes that her symptoms started yesterday around 9:00 at night.  Her symptoms started with nausea and vomiting that has essentially been nonstop since that time.  She describes the emesis as being clear and yellow in color.  She also notes 2 loose bowel movements this morning, but none since.  He notes that she does have stomach discomfort.  She describes it less as pain, but more as uncomfortable, nausea, and aching with the vomiting.  She notes that about 1 week ago she started taking more Tylenol and Advil.  She is unable to tell me how much Tylenol she was taking, but describes it as "Susan Beasley".  She estimates may be for 500 mg tablets about every 4 hours or even more.  She was taking Susan Beasley of Advil as well, but similarly cannot describe how much she was taking.  She was taking these analgesics for tooth pain.  She had Susan Beasley tooth pulled about 2 months ago and the adjacent to tooth started hurting about Susan Beasley week ago.   She denies any fevers.  She notes chills occasionally.  She denies any sore throat, cough.  She denies any chest pain or trouble breathing.  She denies any dysuria.  She denies any numbness, tingling, weakness.  She does have Susan Beasley history of depression, but notes that this is well controlled off medicines and she does not have Susan Beasley desire to hurt or harm herself.  She denies any recent sick contacts.  She denies any recent  travel.  She did not eat anything abnormal in the past few days and no one else around her has been similarly sick.   ED Course: Labs, IVF, antiemetics, RUQ [place].  Discussed with poison control who recommended NAC load and infusion and follow up repeat blood work.    Review of Systems: As per HPI otherwise 10 point review of systems negative.   Past Medical History:  Diagnosis Date  . Ovarian cyst   . Peptic ulcer     History reviewed. No pertinent surgical history.   reports that  has never smoked. she has never used smokeless tobacco. She reports that she does not drink alcohol or use drugs.  No Known Allergies  Family History  Problem Relation Age of Onset  . Hypertension Father      Prior to Admission medications   Not on File    Physical Exam: Vitals:   07/07/17 1215 07/07/17 1340 07/07/17 1516 07/07/17 1714  BP: 115/70 110/70  113/60  Pulse: 62 67  (!) 56  Resp: 18 10  12   Temp: 97.8 F (36.6 C)   98.3 F (36.8 C)  TempSrc: Oral   Oral  SpO2: 100% 100%  100%  Weight:   54.4 kg (120 lb) 54.4 kg (120 lb)  Height:    5\' 2"  (1.575 m)    Constitutional: Appears uncomfortable, frequent vomiting Vitals:   07/07/17 1215 07/07/17 1340 07/07/17 1516 07/07/17 1714  BP:  115/70 110/70  113/60  Pulse: 62 67  (!) 56  Resp: 18 10  12   Temp: 97.8 F (36.6 C)   98.3 F (36.8 C)  TempSrc: Oral   Oral  SpO2: 100% 100%  100%  Weight:   54.4 kg (120 lb) 54.4 kg (120 lb)  Height:    5\' 2"  (1.575 m)   Eyes: PERRL, lids and conjunctivae normal. Anicteric sclera. ENMT: Mucous membranes are moist. Posterior pharynx clear of any exudate or lesions.Normal dentition.  L posterior molar with silver cap, no erythema or visible purulence to gum. Neck: normal, supple, no masses, no thyromegaly Respiratory: clear to auscultation bilaterally, no wheezing, no crackles. Normal respiratory effort. No accessory muscle use.  Cardiovascular: Regular rate and rhythm, no murmurs / rubs / gallops.  No extremity edema. 2+ pedal pulses.  Abdomen: mildly tender in epigastric region, no masses palpated. No hepatosplenomegaly. Bowel sounds positive.  Musculoskeletal: no clubbing / cyanosis. No joint deformity upper and lower extremities. Good ROM, no contractures. Normal muscle tone.  Skin: no rashes, lesions, ulcers. No induration Neurologic: CN 2-12 grossly intact. Sensation intact. Strength 5/5 in all 4.  Psychiatric: Normal judgment and insight. Alert and oriented x 3. Normal mood.   Labs on Admission: I have personally reviewed following labs and imaging studies  CBC: Recent Labs  Lab 07/07/17 1221  WBC 16.9*  HGB 15.5*  HCT 43.7  MCV 90.1  PLT 251   Basic Metabolic Panel: Recent Labs  Lab 07/07/17 1221  NA 139  K 3.7  CL 105  CO2 20*  GLUCOSE 119*  BUN 20  CREATININE 0.77  CALCIUM 9.8   GFR: Estimated Creatinine Clearance: 86.5 mL/min (by C-G formula based on SCr of 0.77 mg/dL). Liver Function Tests: Recent Labs  Lab 07/07/17 1221  AST 153*  ALT 188*  ALKPHOS 67  BILITOT 2.3*  PROT 8.7*  ALBUMIN 5.1*   Recent Labs  Lab 07/07/17 1221  LIPASE 21   No results for input(s): AMMONIA in the last 168 hours. Coagulation Profile: Recent Labs  Lab 07/07/17 1221  INR 1.58   Cardiac Enzymes: No results for input(s): CKTOTAL, CKMB, CKMBINDEX, TROPONINI in the last 168 hours. BNP (last 3 results) No results for input(s): PROBNP in the last 8760 hours. HbA1C: No results for input(s): HGBA1C in the last 72 hours. CBG: No results for input(s): GLUCAP in the last 168 hours. Lipid Profile: No results for input(s): CHOL, HDL, LDLCALC, TRIG, CHOLHDL, LDLDIRECT in the last 72 hours. Thyroid Function Tests: No results for input(s): TSH, T4TOTAL, FREET4, T3FREE, THYROIDAB in the last 72 hours. Anemia Panel: No results for input(s): VITAMINB12, FOLATE, FERRITIN, TIBC, IRON, RETICCTPCT in the last 72 hours. Urine analysis:    Component Value Date/Time    COLORURINE AMBER (Jheri Mitter) 07/07/2017 1234   APPEARANCEUR CLEAR 07/07/2017 1234   LABSPEC 1.025 07/07/2017 1234   PHURINE 6.0 07/07/2017 1234   GLUCOSEU NEGATIVE 07/07/2017 1234   HGBUR NEGATIVE 07/07/2017 1234   BILIRUBINUR SMALL (Ellenore Roscoe) 07/07/2017 1234   KETONESUR >80 (Yatzil Clippinger) 07/07/2017 1234   PROTEINUR 30 (Marianita Botkin) 07/07/2017 1234   NITRITE NEGATIVE 07/07/2017 1234   LEUKOCYTESUR NEGATIVE 07/07/2017 1234    Radiological Exams on Admission: US Abdomen Limited Ruq  Result Date: 07/07/2017 CLINICAL DATA:  Nausea, vomiting, diarrhea since yesterday. EXAM: ULTRASOUND ABDOMEN LIMITED RIGHT UPPER QUADRANT COMPARISON:  None. FINDINGS: Gallbladder: No gallstones or wall thickening visualized. No sonographic Murphy sign noted by sonographer. Common bile duct: Diameter: 2 mm Liver: No focal  lesion identified. Within normal limits in parenchymal echogenicity. Portal vein is patent on color Doppler imaging with normal direction of blood flow towards the liver. IMPRESSION: Normal right upper quadrant ultrasound. Electronically Signed   By: Elige Ko   On: 07/07/2017 14:18    EKG: Independently reviewed. Ectopic atrial rhythm.  RSR' in V1, V2.  T wave inversion in V1-V3.  No priors for comparison.  Assessment/Plan Active Problems:   Overdose   Nausea & vomiting  Nausea  Vomiting  Diarrhea  Suspected Accidental Tylenol Overdose: Patient taking large amounts of Tylenol and Advil over the past week.  She presents with symptoms concerning for Tylenol overdose with elevated liver enzymes.  Case was discussed with poison control by ED provider, myself and pharmacy.  Recommending NAC bolus and infusion and repeat labs 22 hours after bolus.  NAC bolus and infusion per pharmacy Negative APAP, salicylate levels Follow utox Zofran, phenergan prn IV PPI daily NPO, ok for sips/chips Repeat LFT's tomorrow ~1330 Follow up with poison control at that point Mild abdominal pain on exam, suspect 2/2 vomiting, but consider  further imaging if persistent  Elevated LFT's:  Elevated AST, ALT, and bili.  INR also elevated to 1.58.  Suspect 2/2 above.  NAC and follow labs and f/u with poison control.  Leukocytosis: suspect reactive.  Follow.   NAGMA: mild, follow with IVF  Abnormal EKG: follow repeat tomorrow, consider echo.  Post Traumatic Stress  Anxiety  Depression: stable off meds.  Denies SI.   Abnormal UA: with bacteria, ketones, bilirubin.  Pt asx.  Culture and treat only if she develops sx.   History of PUD:  Don't think this is contributing, but IV PPI as above  ? Hx of Crohn's: notes possible hx, but not clear.  Outpatient f/u.   DVT prophylaxis: SCD's Code Status: full   Family Communication: none at bedsdie, declined calling  Disposition Plan: home pending improvement  Consults called: none  Admission status: tele    Lacretia Nicks MD [facility] Pager [phone]  If 7PM-7AM, please contact night-coverage [email or web address] Password [facility]  07/07/2017, 6:25 PM

## 2017-07-07 NOTE — ED Notes (Signed)
Report given to Tresa Endo, RN receiving nurse at [facility].

## 2017-07-07 NOTE — Progress Notes (Addendum)
MEDICATION RELATED CONSULT NOTE - FOLLOW UP  Pharmacy Consult for Acetylcysteine Indication: r/o Acetaminophen toxicity  No Known Allergies  Patient Measurements: Height: 5\' 2"  (157.5 cm) Weight: 120 lb (54.4 kg) IBW/kg (Calculated) : 50.1  Vital Signs: Temp: 98.3 F (36.8 C) (03/03 1714) Temp Source: Oral (03/03 1714) BP: 113/60 (03/03 1714) Pulse Rate: 56 (03/03 1714) Intake/Output from previous day: No intake/output data recorded. Intake/Output from this shift: Total I/O In: 1000 [IV Piggyback:1000] Out: -   Labs: Recent Labs    07/07/17 1221  WBC 16.9*  HGB 15.5*  HCT 43.7  PLT 251  APTT 31  CREATININE 0.77  ALBUMIN 5.1*  PROT 8.7*  AST 153*  ALT 188*  ALKPHOS 67  BILITOT 2.3*   Estimated Creatinine Clearance: 86.5 mL/min (by C-G formula based on SCr of 0.77 mg/dL).   Microbiology: No results found for this or any previous visit (from the past 720 hour(s)).  Medications:  Scheduled:  . acetylcysteine      . acetylcysteine      . [START ON 07/08/2017] Influenza vac split quadrivalent PF  0.5 mL Intramuscular Tomorrow-1000  . [START ON 07/08/2017] pantoprazole (PROTONIX) IV  40 mg Intravenous Daily   Infusions:  . acetylcysteine 15 mg/kg/hr (07/07/17 1911)  . dextrose 5 % and 0.9% NaCl 75 mL/hr at 07/07/17 1856   Assessment: Acetylcysteine 8100 mg bolus only mixed at [facility] and infused during transfer to [facility].  Verified with [facility] that only a bag with Acetylcysteine 40mg /ml was infused. Tried to verify dose with [facility] personnel but they were unable to tell me how much medication was given. - called Poison Center, no adjustments needed as long as infusion begun within 4 hr of bolus. Mixed 20,000/551ml bag for 23 hr infusion.  Plan:  CMET, Hepatic fx panel, INR, Acetaminophen level for am and 24 hr after treatment start  Chilton Si, Keiasia Christianson L 07/07/2017,6:24 PM

## 2017-07-07 NOTE — ED Notes (Signed)
ED Provider at bedside. 

## 2017-07-07 NOTE — ED Provider Notes (Addendum)
[facility] EMERGENCY DEPARTMENT Provider Note   CSN: [id number] Arrival date & time: 07/07/17  1208     History   Chief Complaint Chief Complaint  Patient presents with  . Nausea    HPI Susan Beasley is a 22 y.o. female.  Patient is a 22 year old female who presents with nausea vomiting diarrhea.  She reports that she started vomiting last night and has ongoing vomiting throughout today.  She has some watery diarrhea.  Her emesis is nonbloody and nonbilious.  She has had some chills and subjective fevers.  No URI symptoms.  No cough.  No abdominal pain other than during the vomiting episodes.  No urinary symptoms.  She states that she has been taking a lot of ibuprofen related to a toothache.  She has pain to her left lower back molar that has a feeling.  She has no swelling.  She has called her dentist and expects to hear back from him by tomorrow hopefully.      Past Medical History:  Diagnosis Date  . Ovarian cyst   . Peptic ulcer     There are no active problems to display for this patient.   History reviewed. No pertinent surgical history.  OB History    No data available       Home Medications    Prior to Admission medications   Medication Sig Start Date End Date Taking? Authorizing Provider  amoxicillin (AMOXIL) 500 MG capsule Take 1 capsule (500 mg total) by mouth 3 (three) times daily. Patient not taking: Reported on 01/21/2015 04/15/14   Emilia Beck, PA-C  cyclobenzaprine (FLEXERIL) 10 MG tablet Take 1 tablet (10 mg total) by mouth 2 (two) times daily as needed for muscle spasms. 01/13/16   Horton, Mayer Masker, MD    Family History History reviewed. No pertinent family history.  Social History Social History   Tobacco Use  . Smoking status: Never Smoker  . Smokeless tobacco: Never Used  Substance Use Topics  . Alcohol use: No  . Drug use: No     Allergies   Patient has no known allergies.   Review of Systems Review of  Systems  Constitutional: Positive for chills. Negative for diaphoresis, fatigue and fever.  HENT: Positive for dental problem. Negative for congestion, rhinorrhea and sneezing.   Eyes: Negative.   Respiratory: Negative for cough, chest tightness and shortness of breath.   Cardiovascular: Negative for chest pain and leg swelling.  Gastrointestinal: Positive for abdominal pain, diarrhea, nausea and vomiting. Negative for blood in stool.  Genitourinary: Negative for difficulty urinating, flank pain, frequency and hematuria.  Musculoskeletal: Negative for arthralgias and back pain.  Skin: Negative for rash.  Neurological: Negative for dizziness, speech difficulty, weakness, numbness and headaches.     Physical Exam Updated Vital Signs BP 110/70   Pulse 67   Temp 97.8 F (36.6 C) (Oral)   Resp 10   Wt 54.4 kg (120 lb)   LMP 06/17/2017 (Exact Date)   SpO2 100%   BMI 21.26 kg/m   Physical Exam  Constitutional: She is oriented to person, place, and time. She appears well-developed and well-nourished.  HENT:  Head: Normocephalic and atraumatic.  There is mild tenderness along the left lower back molar.  There is a filling intact.  There is no swelling.  No induration or fluctuance.  No trismus.  Eyes: Pupils are equal, round, and reactive to light.  Neck: Normal range of motion. Neck supple.  Cardiovascular: Normal rate, regular rhythm  and normal heart sounds.  Pulmonary/Chest: Effort normal and breath sounds normal. No respiratory distress. She has no wheezes. She has no rales. She exhibits no tenderness.  Abdominal: Soft. Bowel sounds are normal. There is no tenderness. There is no rebound and no guarding.  Musculoskeletal: Normal range of motion. She exhibits no edema.  Lymphadenopathy:    She has no cervical adenopathy.  Neurological: She is alert and oriented to person, place, and time.  Skin: Skin is warm and dry. No rash noted.  Psychiatric: She has a normal mood and affect.       ED Treatments / Results  Labs (all labs ordered are listed, but only abnormal results are displayed) Labs Reviewed  COMPREHENSIVE METABOLIC PANEL - Abnormal; Notable for the following components:      Result Value   CO2 20 (*)    Glucose, Bld 119 (*)    Total Protein 8.7 (*)    Albumin 5.1 (*)    AST 153 (*)    ALT 188 (*)    Total Bilirubin 2.3 (*)    All other components within normal limits  CBC - Abnormal; Notable for the following components:   WBC 16.9 (*)    Hemoglobin 15.5 (*)    All other components within normal limits  URINALYSIS, ROUTINE W REFLEX MICROSCOPIC - Abnormal; Notable for the following components:   Color, Urine AMBER (*)    Bilirubin Urine SMALL (*)    Ketones, ur >80 (*)    Protein, ur 30 (*)    All other components within normal limits  URINALYSIS, MICROSCOPIC (REFLEX) - Abnormal; Notable for the following components:   Bacteria, UA MANY (*)    Squamous Epithelial / LPF 0-5 (*)    All other components within normal limits  ACETAMINOPHEN LEVEL - Abnormal; Notable for the following components:   Acetaminophen (Tylenol), Serum <10 (*)    All other components within normal limits  PROTIME-INR - Abnormal; Notable for the following components:   Prothrombin Time 18.7 (*)    All other components within normal limits  LIPASE, BLOOD  PREGNANCY, URINE  APTT  SALICYLATE LEVEL    EKG  EKG Interpretation  Date/Time:  Sunday July 07 2017 13:39:21 EST Ventricular Rate:  71 PR Interval:    QRS Duration: 102 QT Interval:  407 QTC Calculation: 443 R Axis:   75 Text Interpretation:  Ectopic atrial rhythm Borderline short PR interval RSR' in V1 or V2, right VCD or RVH ST elev, probable normal early repol pattern No old tracing to compare Confirmed by Rolan Bucco [phone]) on 07/07/2017 1:44:16 PM       Radiology US Abdomen Limited Ruq  Result Date: 07/07/2017 CLINICAL DATA:  Nausea, vomiting, diarrhea since yesterday. EXAM: ULTRASOUND ABDOMEN  LIMITED RIGHT UPPER QUADRANT COMPARISON:  None. FINDINGS: Gallbladder: No gallstones or wall thickening visualized. No sonographic Murphy sign noted by sonographer. Common bile duct: Diameter: 2 mm Liver: No focal lesion identified. Within normal limits in parenchymal echogenicity. Portal vein is patent on color Doppler imaging with normal direction of blood flow towards the liver. IMPRESSION: Normal right upper quadrant ultrasound. Electronically Signed   By: Elige Ko   On: 07/07/2017 14:18    Procedures Procedures (including critical care time)  Medications Ordered in ED Medications  acetylcysteine (ACETADOTE) 40 mg/mL load via infusion 150 mg/kg (not administered)    Followed by  acetylcysteine (ACETADOTE) 40,000 mg in dextrose 5 % 1,000 mL (40 mg/mL) infusion (not administered)  acetylcysteine (ACETADOTE) 200  MG/ML injection (not administered)  acetylcysteine (ACETADOTE) 200 MG/ML injection (not administered)  ondansetron (ZOFRAN) injection 4 mg (4 mg Intravenous Given 07/07/17 1234)  sodium chloride 0.9 % bolus 1,000 mL (0 mLs Intravenous Stopped 07/07/17 1501)  pantoprazole (PROTONIX) injection 40 mg (40 mg Intravenous Given 07/07/17 1340)     Initial Impression / Assessment and Plan / ED Course  I have reviewed the triage vital signs and the nursing notes.  Pertinent labs & imaging results that were available during my care of the patient were reviewed by me and considered in my medical decision making (see chart for details).  Clinical Course as of Jul 08 1515  Sun Jul 07, 2017  1330 PT actually says that she has been taking more tylenol than ibuprofen and has been taking up to 500mg  x3 every 2 hours, although estimates that she has taken about 10-12 in last day, last dose was about 10pm last night.  I have contacted poison control regarding possible N-AC given elevated LFTs.  Waiting for them to discuss with toxicologist.  [MB]  1344 Toxicologist recommends N-AC for 24 hours, then  reassess whether to continue.  Will need repeat LFTs and INR 22 hours after loading dose given per poison control  [MB]    Clinical Course User Index [MB] Rolan Bucco, MD    Patient is a 22 year old female who presents with vomiting and diarrhea.  She has minimal abdominal tenderness on exam.  She is noted to have elevated LFTs.  Given this I question her more about the medications she is been using for teeth.  She initially stated that she been using more ibuprofen but actually then stated that she had taken a lot of Tylenol.  She has been taking increased amount of Tylenol over the last week.  She cannot really quantify how much she is been taking but she does say at times she will take 3 of the 500 mg tablets every 2 hours.  She does have some elevation in her LFTs.  Her coags are normal.  She has no EKG changes.  I spoke with poison control who recommends treating him with N-acetylcysteine for 24 hours and then reassessing.  She will need repeat blood work done 22 hours after the loading dose of N-acetylcysteine.  I did also do a gallbladder ultrasound given her symptoms which was negative for gallbladder disease.  She had no ongoing vomiting after the IV fluids and antiemetics.  I will consult the hospitalist at [facility] for admission.  I spoke with Dr. Izola Price who has accepted the patient for transfer to [facility].  CRITICAL CARE Performed by: Rolan Bucco Total critical care time: 60 minutes Critical care time was exclusive of separately billable procedures and treating other patients. Critical care was necessary to treat or prevent imminent or life-threatening deterioration. Critical care was time spent personally by me on the following activities: development of treatment plan with patient and/or surrogate as well as nursing, discussions with consultants, evaluation of patient's response to treatment, examination of patient, obtaining history from patient or surrogate, ordering and performing treatments  and interventions, ordering and review of laboratory studies, ordering and review of radiographic studies, pulse oximetry and re-evaluation of patient's condition.   Final Clinical Impressions(s) / ED Diagnoses   Final diagnoses:  RUQ pain  Non-intractable vomiting with nausea, unspecified vomiting type  Accidental acetaminophen overdose, initial encounter    ED Discharge Orders    None       Rolan Bucco, MD 07/07/17 [phone]  Rolan Bucco, MD 07/07/17 [phone]

## 2017-07-07 NOTE — ED Triage Notes (Signed)
PResents with nausea, vomiting and diarrhea began last night and has been ongoing all morinig. LASt emeis 15 minutes ago. Endorses chills and complains of left lower tooth pain.

## 2017-07-07 NOTE — Progress Notes (Signed)
New Admission Note:    Arrival Method: Stretcher from [facility] Mental Orientation: Alert + oriented Assessment: In progress Skin: WNL IV: RAC Pain: 0/10 Safety Measures: SIderails up x 2 callbell in reach Admission:In progress 5E Orientation:Completed Family:  None at bedside  Orders have been reviewed and implemented.  Will continue to monitor the patient.

## 2017-07-08 LAB — COMPREHENSIVE METABOLIC PANEL
ALBUMIN: 3.4 g/dL — AB (ref 3.5–5.0)
ALK PHOS: 41 U/L (ref 38–126)
ALT: 1967 U/L — AB (ref 14–54)
AST: 1737 U/L — AB (ref 15–41)
Anion gap: 9 (ref 5–15)
BUN: 13 mg/dL (ref 6–20)
CALCIUM: 8.2 mg/dL — AB (ref 8.9–10.3)
CO2: 20 mmol/L — AB (ref 22–32)
Chloride: 109 mmol/L (ref 101–111)
Creatinine, Ser: 0.72 mg/dL (ref 0.44–1.00)
GFR calc Af Amer: >60 mL/min (ref >60)
GFR calc non Af Amer: >60 mL/min (ref >60)
GLUCOSE: 107 mg/dL — AB (ref 65–99)
Potassium: 2.8 mmol/L — ABNORMAL LOW (ref 3.5–5.1)
SODIUM: 138 mmol/L (ref 135–145)
TOTAL PROTEIN: 6 g/dL — AB (ref 6.5–8.1)
Total Bilirubin: 1.1 mg/dL (ref 0.3–1.2)

## 2017-07-08 LAB — HEPATIC FUNCTION PANEL
ALBUMIN: 3.3 g/dL — AB (ref 3.5–5.0)
ALK PHOS: 42 U/L (ref 38–126)
ALK PHOS: 46 U/L (ref 38–126)
ALT: 1265 U/L — ABNORMAL HIGH (ref 14–54)
ALT: 2556 U/L — ABNORMAL HIGH (ref 14–54)
AST: 1650 U/L — ABNORMAL HIGH (ref 15–41)
AST: 1461 U/L — AB (ref 15–41)
Albumin: 3.7 g/dL (ref 3.5–5.0)
BILIRUBIN DIRECT: 0.3 mg/dL (ref 0.1–0.5)
BILIRUBIN DIRECT: 0.3 mg/dL (ref 0.1–0.5)
BILIRUBIN TOTAL: 1.2 mg/dL (ref 0.3–1.2)
Indirect Bilirubin: 0.9 mg/dL (ref 0.3–0.9)
Indirect Bilirubin: 1.1 mg/dL — ABNORMAL HIGH (ref 0.3–0.9)
Total Bilirubin: 1.4 mg/dL — ABNORMAL HIGH (ref 0.3–1.2)
Total Protein: 5.8 g/dL — ABNORMAL LOW (ref 6.5–8.1)
Total Protein: 6.4 g/dL — ABNORMAL LOW (ref 6.5–8.1)

## 2017-07-08 LAB — PROTIME-INR
INR: 2.42
INR: 1.98
PROTHROMBIN TIME: 26.1 s — AB (ref 11.4–15.2)
Prothrombin Time: 22.3 seconds — ABNORMAL HIGH (ref 11.4–15.2)

## 2017-07-08 LAB — AMMONIA: Ammonia: <9 umol/L — ABNORMAL LOW (ref 9–35)

## 2017-07-08 LAB — CBC
HCT: 37.1 % (ref 36.0–46.0)
HEMOGLOBIN: 12.8 g/dL (ref 12.0–15.0)
MCH: 31.7 pg (ref 26.0–34.0)
MCHC: 34.5 g/dL (ref 30.0–36.0)
MCV: 91.8 fL (ref 78.0–100.0)
Platelets: 184 10*3/uL (ref 150–400)
RBC: 4.04 MIL/uL (ref 3.87–5.11)
RDW: 12.1 % (ref 11.5–15.5)
WBC: 14.1 10*3/uL — ABNORMAL HIGH (ref 4.0–10.5)

## 2017-07-08 LAB — MAGNESIUM: Magnesium: 1.7 mg/dL (ref 1.7–2.4)

## 2017-07-08 LAB — ACETAMINOPHEN LEVEL: Acetaminophen (Tylenol), Serum: <10 ug/mL — ABNORMAL LOW (ref 10–30)

## 2017-07-08 LAB — HIV ANTIBODY (ROUTINE TESTING W REFLEX): HIV Screen 4th Generation wRfx: NONREACTIVE

## 2017-07-08 MED ORDER — POTASSIUM CHLORIDE CRYS ER 20 MEQ PO TBCR: 40.0000 meq | EXTENDED_RELEASE_TABLET | Freq: Once | ORAL | Status: DC

## 2017-07-08 MED ORDER — KCL IN DEXTROSE-NACL 20-5-0.9 MEQ/L-%-% IV SOLN
INTRAVENOUS | Status: DC
  Administered 2017-07-08: 20:00:00 via INTRAVENOUS
  Filled 2017-07-08 (×4): qty 1000

## 2017-07-08 MED ORDER — KCL IN DEXTROSE-NACL 40-5-0.9 MEQ/L-%-% IV SOLN
INTRAVENOUS | Status: DC
  Administered 2017-07-08: 12:00:00 via INTRAVENOUS
  Filled 2017-07-08: qty 1000

## 2017-07-08 MED ORDER — DEXTROSE 5 % IV SOLN
15.0000 mg/kg/h | INTRAVENOUS | Status: DC
Start: 2017-07-08 — End: 2017-07-09
  Administered 2017-07-08: 15 mg/kg/h via INTRAVENOUS
  Filled 2017-07-08 (×2): qty 100

## 2017-07-08 MED ORDER — LIP MEDEX EX OINT
TOPICAL_OINTMENT | CUTANEOUS | Status: AC
  Administered 2017-07-08: 12:00:00
  Filled 2017-07-08: qty 7

## 2017-07-08 MED ORDER — POTASSIUM CHLORIDE 10 MEQ/100ML IV SOLN
10.0000 meq | INTRAVENOUS | Status: DC
  Filled 2017-07-08: qty 100

## 2017-07-08 MED ORDER — PANTOPRAZOLE SODIUM 40 MG PO TBEC
40.0000 mg | DELAYED_RELEASE_TABLET | Freq: Every day | ORAL | Status: DC
  Administered 2017-07-09 – 2017-07-10 (×2): 40 mg via ORAL
  Filled 2017-07-08 (×2): qty 1

## 2017-07-08 MED ORDER — POTASSIUM CHLORIDE CRYS ER 20 MEQ PO TBCR: 20.0000 meq | EXTENDED_RELEASE_TABLET | Freq: Once | ORAL | Status: DC

## 2017-07-08 MED ORDER — POTASSIUM CHLORIDE CRYS ER 20 MEQ PO TBCR
40.0000 meq | EXTENDED_RELEASE_TABLET | ORAL | Status: AC
  Administered 2017-07-08 (×2): 40 meq via ORAL
  Filled 2017-07-08 (×2): qty 2

## 2017-07-08 NOTE — Progress Notes (Addendum)
MEDICATION RELATED CONSULT NOTE - FOLLOW UP  Pharmacy Consult for Acetylcysteine Indication: acetaminophen toxicity  No Known Allergies  Patient Measurements: Height: 5\' 2"  (157.5 cm) Weight: 114 lb 6.4 oz (51.9 kg) IBW/kg (Calculated) : 50.1  Vital Signs: Temp: 98.4 F (36.9 C) (03/04 1525) Temp Source: Oral (03/04 1525) BP: 99/49 (03/04 1525) Pulse Rate: 59 (03/04 1525) Intake/Output from previous day: 03/03 0701 - 03/04 0700 In: 2644.1 [I.V.:1644.1; IV Piggyback:1000] Out: -  Intake/Output from this shift: No intake/output data recorded.  Labs: Recent Labs    07/07/17 1221 07/08/17 0611 07/08/17 1340  WBC 16.9* 14.1*  --   HGB 15.5* 12.8  --   HCT 43.7 37.1  --   PLT 251 184  --   APTT 31  --   --   CREATININE 0.77 0.72  --   MG  --  1.7  --   ALBUMIN 5.1* 3.4* 3.3*  PROT 8.7* 6.0* 5.8*  AST 153* 1,737* 1,650*  ALT 188* 1,967* 1,265*  ALKPHOS 67 41 42  BILITOT 2.3* 1.1 1.2  BILIDIR  --   --  0.3  IBILI  --   --  0.9   INR: 3/3: 1.58 3/4: 2.42      Medications:  Scheduled:  . Influenza vac split quadrivalent PF  0.5 mL Intramuscular Tomorrow-1000  . pantoprazole (PROTONIX) IV  40 mg Intravenous Daily   Infusions:  . acetylcysteine 15 mg/kg/hr (07/08/17 1118)  . dextrose 5 % and 0.9 % NaCl with KCl 40 mEq/L 75 mL/hr at 07/08/17 1154   This morning at approximately 0720 RN reported bag was running out early - was found to be running at 40 mL/hr instead of 20.4 mL/hr (which was the rate that would have delivered 15 mg/kg/hr as ordered).   Infusion was then reduced to the ordered rate of 20.4 mL/hr = 15 mg/kg/hr.  Assessment: INR rising Transaminases rising this AM, slightly improved this PM Bilirubin improved to WNL SCr WNL  Attending MD updated Poison Center this AM.  Plan at that time was to continue infusion through 3/5 AM and then re-evaluate labs.  Plan: Continue N-Acetylcysteine IV infusion at 15 mg/kg/hr Has labs ordered for tomorrow  AM:   BMet, hepatic function panel, CBC, ammonia, PT/INR  Elie Goody, PharmD, BCPS [phone] 07/08/2017  6:02 PM

## 2017-07-08 NOTE — Progress Notes (Signed)
PROGRESS NOTE    Susan Beasley  ZOX:[id number] DOB: 01/06/1996 DOA: 07/07/2017 PCP: Patient, No Pcp Per   Brief Narrative:  Susan Beasley is Susan Beasley 22 y.o. female with medical history significant of post traumatic stress, anxiety, depression, gastric ulcers, and history of possible Crohn's as well who presents with nausea, vomiting, abdominal pain and elevated liver enzyme test in the setting of suspected accidental tylenol overdose.   Assessment & Plan:   Active Problems:   Overdose   Nausea & vomiting  Nausea  Vomiting  Diarrhea  Suspected Accidental Tylenol Overdose: Patient taking large amounts of Tylenol and Advil over the past week.  She presents with symptoms concerning for Tylenol overdose with elevated liver enzymes.  Case was discussed with poison control by ED provider, myself and pharmacy.  Improved N/V today Negative APAP, salicylate levels Follow utox pending Zofran, phenergan prn PO PPI daily Advance to clear liquid diet.  Worsening liver function today.  No confusion.  Continue NAC, check labs again this evening (see below) No abdominal pain  Elevated LFT's  APAP overdose:  Worsening AST, ALT this AM as well as INR worsening to 2.42.  Bili improved.  No hepatic encephalopathy.  Of note, pt inadvertently received 2x rate overnight for about 12 hrs of NAC.  This was discussed with poison control who recommended continuing NAC at corrected rate of 15 mg/kg/hr and repeating labs at 7 PM.  Recommend updating poison control at that time and considering d/c NAC if labs improving or continuing until morning if still rising.   Follow acute hepatitis panel Follow liver doppler Consider GI c/s if worsening  Leukocytosis: suspect reactive.  Follow.   NAGMA: mild, follow with IVF  Hypokalemia: mild, add K to IVF, replete PO.  Follow mag.   Abnormal EKG: Repeat EKG today with sinus bradycardia.    Post Traumatic Stress  Anxiety  Depression: stable off meds.  Denies SI.    Abnormal UA: with bacteria, ketones, bilirubin.  Pt asx.  Culture and treat only if she develops sx.   History of PUD:  Don't think this is contributing, but IV PPI as above  ? Hx of Crohn's: notes possible hx, but not clear.  Outpatient f/u.   DVT prophylaxis: scd Code Status: full  Family Communication: discussed with mother at bedside Disposition Plan: pending improvement   Consultants:   Poison Control   Procedures:   RUQ [place]  Antimicrobials:  none    Subjective: Feeling better.  No N/V since last night.  No pain.  Just feeling hungry.   Objective: Vitals:   07/07/17 1714 07/07/17 2053 07/08/17 0621 07/08/17 1525  BP: 113/60 (!) 112/58 109/61 (!) 99/49  Pulse: (!) 56 74 (!) 51 (!) 59  Resp: 12 16 16 16   Temp: 98.3 F (36.8 C) 98.1 F (36.7 C) 98 F (36.7 C) 98.4 F (36.9 C)  TempSrc: Oral Oral Oral Oral  SpO2: 100% 100% 100% 100%  Weight: 54.4 kg (120 lb)  51.9 kg (114 lb 6.4 oz)   Height: 5\' 2"  (1.575 m)       Intake/Output Summary (Last 24 hours) at 07/08/2017 1831 Last data filed at 07/08/2017 0400 Gross per 24 hour  Intake 1644.11 ml  Output -  Net 1644.11 ml   Filed Weights   07/07/17 1516 07/07/17 1714 07/08/17 0621  Weight: 54.4 kg (120 lb) 54.4 kg (120 lb) 51.9 kg (114 lb 6.4 oz)    Examination:  General exam: Appears calm and comfortable.  Anicteric sclera.  Respiratory system: Clear to auscultation. Respiratory effort normal. Cardiovascular system: S1 & S2 heard, RRR. No JVD, murmurs, rubs, gallops or clicks. No pedal edema. Gastrointestinal system: Abdomen is nondistended, soft and nontender. No organomegaly or masses felt. Normal bowel sounds heard. Central nervous system: Alert and oriented. No focal neurological deficits. Extremities: Symmetric 5 x 5 power. Skin: No rashes, lesions or ulcers Psychiatry: Judgement and insight appear normal. Mood & affect appropriate.     Data Reviewed: I have personally reviewed following labs and  imaging studies  CBC: Recent Labs  Lab 07/07/17 1221 07/08/17 0611  WBC 16.9* 14.1*  HGB 15.5* 12.8  HCT 43.7 37.1  MCV 90.1 91.8  PLT 251 184   Basic Metabolic Panel: Recent Labs  Lab 07/07/17 1221 07/08/17 0611  NA 139 138  K 3.7 2.8*  CL 105 109  CO2 20* 20*  GLUCOSE 119* 107*  BUN 20 13  CREATININE 0.77 0.72  CALCIUM 9.8 8.2*  MG  --  1.7   GFR: Estimated Creatinine Clearance: 86.5 mL/min (by C-G formula based on SCr of 0.72 mg/dL). Liver Function Tests: Recent Labs  Lab 07/07/17 1221 07/08/17 0611 07/08/17 1340  AST 153* 1,737* 1,650*  ALT 188* 1,967* 1,265*  ALKPHOS 67 41 42  BILITOT 2.3* 1.1 1.2  PROT 8.7* 6.0* 5.8*  ALBUMIN 5.1* 3.4* 3.3*   Recent Labs  Lab 07/07/17 1221  LIPASE 21   No results for input(s): AMMONIA in the last 168 hours. Coagulation Profile: Recent Labs  Lab 07/07/17 1221 07/08/17 0611  INR 1.58 2.42   Cardiac Enzymes: No results for input(s): CKTOTAL, CKMB, CKMBINDEX, TROPONINI in the last 168 hours. BNP (last 3 results) No results for input(s): PROBNP in the last 8760 hours. HbA1C: No results for input(s): HGBA1C in the last 72 hours. CBG: No results for input(s): GLUCAP in the last 168 hours. Lipid Profile: No results for input(s): CHOL, HDL, LDLCALC, TRIG, CHOLHDL, LDLDIRECT in the last 72 hours. Thyroid Function Tests: No results for input(s): TSH, T4TOTAL, FREET4, T3FREE, THYROIDAB in the last 72 hours. Anemia Panel: No results for input(s): VITAMINB12, FOLATE, FERRITIN, TIBC, IRON, RETICCTPCT in the last 72 hours. Sepsis Labs: No results for input(s): PROCALCITON, LATICACIDVEN in the last 168 hours.  No results found for this or any previous visit (from the past 240 hour(s)).       Radiology Studies: US Abdomen Limited Ruq  Result Date: 07/07/2017 CLINICAL DATA:  Nausea, vomiting, diarrhea since yesterday. EXAM: ULTRASOUND ABDOMEN LIMITED RIGHT UPPER QUADRANT COMPARISON:  None. FINDINGS: Gallbladder: No  gallstones or wall thickening visualized. No sonographic Murphy sign noted by sonographer. Common bile duct: Diameter: 2 mm Liver: No focal lesion identified. Within normal limits in parenchymal echogenicity. Portal vein is patent on color Doppler imaging with normal direction of blood flow towards the liver. IMPRESSION: Normal right upper quadrant ultrasound. Electronically Signed   By: Elige Ko   On: 07/07/2017 14:18        Scheduled Meds: . Influenza vac split quadrivalent PF  0.5 mL Intramuscular Tomorrow-1000  . [START ON 07/09/2017] pantoprazole  40 mg Oral Daily   Continuous Infusions: . acetylcysteine 15 mg/kg/hr (07/08/17 1118)  . dextrose 5 % and 0.9 % NaCl with KCl 20 mEq/L       LOS: 1 day    Time spent: over 30 min    Lacretia Nicks, MD [facility] Pager [phone]  If 7PM-7AM, please contact night-coverage [email or web address] Password [facility] 07/08/2017, 6:31 PM

## 2017-07-09 ENCOUNTER — Inpatient Hospital Stay (HOSPITAL_COMMUNITY): Payer: Self-pay

## 2017-07-09 LAB — HEPATIC FUNCTION PANEL
ALBUMIN: 3.3 g/dL — AB (ref 3.5–5.0)
ALBUMIN: 3.5 g/dL (ref 3.5–5.0)
ALK PHOS: 41 U/L (ref 38–126)
ALK PHOS: 48 U/L (ref 38–126)
ALT: 1948 U/L — ABNORMAL HIGH (ref 14–54)
ALT: 1637 U/L — ABNORMAL HIGH (ref 14–54)
AST: 702 U/L — ABNORMAL HIGH (ref 15–41)
AST: 433 U/L — ABNORMAL HIGH (ref 15–41)
BILIRUBIN DIRECT: 0.6 mg/dL — AB (ref 0.1–0.5)
BILIRUBIN INDIRECT: 1.2 mg/dL — AB (ref 0.3–0.9)
BILIRUBIN TOTAL: 1.8 mg/dL — AB (ref 0.3–1.2)
Bilirubin, Direct: 0.5 mg/dL (ref 0.1–0.5)
Indirect Bilirubin: 1.1 mg/dL — ABNORMAL HIGH (ref 0.3–0.9)
TOTAL PROTEIN: 5.8 g/dL — AB (ref 6.5–8.1)
Total Bilirubin: 1.6 mg/dL — ABNORMAL HIGH (ref 0.3–1.2)
Total Protein: 5.9 g/dL — ABNORMAL LOW (ref 6.5–8.1)

## 2017-07-09 LAB — RAPID URINE DRUG SCREEN, HOSP PERFORMED
AMPHETAMINES: NOT DETECTED
BENZODIAZEPINES: NOT DETECTED
Barbiturates: NOT DETECTED
Cocaine: NOT DETECTED
OPIATES: NOT DETECTED
Tetrahydrocannabinol: POSITIVE — AB

## 2017-07-09 LAB — CBC
HEMATOCRIT: 36.2 % (ref 36.0–46.0)
HEMOGLOBIN: 12.3 g/dL (ref 12.0–15.0)
MCH: 31.5 pg (ref 26.0–34.0)
MCHC: 34 g/dL (ref 30.0–36.0)
MCV: 92.8 fL (ref 78.0–100.0)
PLATELETS: 150 10*3/uL (ref 150–400)
RBC: 3.9 MIL/uL (ref 3.87–5.11)
RDW: 12.4 % (ref 11.5–15.5)
WBC: 7.9 10*3/uL (ref 4.0–10.5)

## 2017-07-09 LAB — BASIC METABOLIC PANEL
Anion gap: 6 (ref 5–15)
BUN: 7 mg/dL (ref 6–20)
CHLORIDE: 114 mmol/L — AB (ref 101–111)
CO2: 21 mmol/L — ABNORMAL LOW (ref 22–32)
Calcium: 8.3 mg/dL — ABNORMAL LOW (ref 8.9–10.3)
Creatinine, Ser: 0.6 mg/dL (ref 0.44–1.00)
Glucose, Bld: 107 mg/dL — ABNORMAL HIGH (ref 65–99)
POTASSIUM: 3.5 mmol/L (ref 3.5–5.1)
SODIUM: 141 mmol/L (ref 135–145)

## 2017-07-09 LAB — AMMONIA: Ammonia: 38 umol/L — ABNORMAL HIGH (ref 9–35)

## 2017-07-09 LAB — PROTIME-INR
INR: 1.91
INR: 1.56
PROTHROMBIN TIME: 18.5 s — AB (ref 11.4–15.2)
Prothrombin Time: 21.7 seconds — ABNORMAL HIGH (ref 11.4–15.2)

## 2017-07-09 LAB — HEPATITIS PANEL, ACUTE
HCV Ab: <0.1 s/co ratio (ref 0.0–0.9)
Hep A IgM: NEGATIVE
Hep B C IgM: NEGATIVE
Hepatitis B Surface Ag: NEGATIVE

## 2017-07-09 MED ORDER — OXYCODONE HCL 5 MG PO TABS
2.5000 mg | ORAL_TABLET | ORAL | Status: DC | PRN
  Administered 2017-07-09: 2.5 mg via ORAL
  Filled 2017-07-09: qty 1

## 2017-07-09 MED ORDER — OXYCODONE HCL 5 MG PO TABS
5.0000 mg | ORAL_TABLET | Freq: Four times a day (QID) | ORAL | Status: DC | PRN
  Administered 2017-07-09: 5 mg via ORAL
  Filled 2017-07-09: qty 1

## 2017-07-09 MED ORDER — AMOXICILLIN-POT CLAVULANATE 875-125 MG PO TABS
1.0000 | ORAL_TABLET | Freq: Two times a day (BID) | ORAL | Status: DC
  Administered 2017-07-09 – 2017-07-10 (×3): 1 via ORAL
  Filled 2017-07-09 (×3): qty 1

## 2017-07-09 NOTE — Progress Notes (Signed)
PROGRESS NOTE    Susan Beasley  DJT:[id number] DOB: 1995/09/27 DOA: 07/07/2017 PCP: Patient, No Pcp Per   Brief Narrative:  Susan Beasley is a 22 y.o. female with medical history significant of post traumatic stress, anxiety, depression, gastric ulcers, and history of possible Crohn's as well who presents with nausea, vomiting, abdominal pain and elevated liver enzyme test in the setting of suspected accidental tylenol overdose.   Assessment & Plan:   Active Problems:   Overdose   Nausea & vomiting  Nausea  Vomiting  Diarrhea  Suspected Accidental Tylenol Overdose: Patient taking large amounts of Tylenol and Advil over the past week.  She presents with symptoms concerning for Tylenol overdose with elevated liver enzymes.  Case was discussed with poison control by ED provider, myself and pharmacy.  Continued improvement in GI sx today on 3/5 Negative APAP, salicylate levels Follow utox pending Zofran, phenergan prn PO PPI daily Advance to regular diet after [place] No abdominal pain  Elevated LFT's  APAP overdose:  No evidence of encephalopathy, LFT's improving at this point.  Bumped to peak INR 2.42 and AST 1737 at 0611 on 3/4 and ALT of 2,556 at 1902 on 3/4.  Now labs all downtrending.  AST 702 this AM, ALT 1948, INR 1.91.  Of note, pt inadvertently received 2x rate overnight for about 12 hrs of NAC on her night of admission (safety portal filed).  This was discussed with poison control who recommended continuing NAC at corrected rate of 15 mg/kg/hr.  Discussed with poison control this morning who noted ok to d/c NAC with improving labs.  Follow acute hepatitis panel (negative) Follow liver doppler (pending) Will hold off on further lab testing for elevated liver enzymes given story c/w APAP overdose Consider GI c/s if worsening  Likely Dental Infection: no purulence or fluctuance on my exam, but indurated and tender gum.  Start augmentin.  Recommended dental follow up.  Oxycodone for  pain.   Leukocytosis: improved  NAGMA: mild, follow with IVF  Hypokalemia: improved  Abnormal EKG: Repeat EKG with sinus bradycardia.    Post Traumatic Stress  Anxiety  Depression: stable off meds.  Denies SI.   Abnormal UA: with bacteria, ketones, bilirubin.  Pt asx.  Culture and treat only if she develops sx.   History of PUD:  Don't think this is contributing, but PO PPI  ? Hx of Crohn's: notes possible hx, but not clear.  Outpatient f/u.   DVT prophylaxis: scd Code Status: full  Family Communication: discussed with mother at bedside Disposition Plan: pending improvement   Consultants:   Poison Control   Procedures:   RUQ [place]  Antimicrobials:  none    Subjective: Just hungry.  No N/V.  No pain.   Objective: Vitals:   07/08/17 1525 07/08/17 2051 07/09/17 0425 07/09/17 0500  BP: (!) 99/49 115/60 108/69   Pulse: (!) 59 (!) 55 (!) 55   Resp: 16 16 17    Temp: 98.4 F (36.9 C) 98.3 F (36.8 C) 98.3 F (36.8 C)   TempSrc: Oral Oral Oral   SpO2: 100% 100% 100%   Weight:    57.6 kg (127 lb)  Height:        Intake/Output Summary (Last 24 hours) at 07/09/2017 0813 Last data filed at 07/08/2017 1800 Gross per 24 hour  Intake 360 ml  Output -  Net 360 ml   Filed Weights   07/07/17 1714 07/08/17 0621 07/09/17 0500  Weight: 54.4 kg (120 lb) 51.9 kg (114  lb 6.4 oz) 57.6 kg (127 lb)    Examination:  General: No acute distress.  Anicteric sclera.  Cardiovascular: Heart sounds show a regular rate, and rhythm. No gallops or rubs. No murmurs. No JVD. Lungs: Clear to auscultation bilaterally with good air movement. No rales, rhonchi or wheezes. Abdomen: Soft, nontender, nondistended with normal active bowel sounds. No masses. No hepatosplenomegaly. Neurological: Alert and oriented 3. Moves all extremities 4. Cranial nerves II through XII grossly intact. Skin: Warm and dry. No rashes or lesions. Extremities: No clubbing or cyanosis. No edema.  Psychiatric: Mood  and affect are normal. Insight and judgment are appropriate.    Data Reviewed: I have personally reviewed following labs and imaging studies  CBC: Recent Labs  Lab 07/07/17 1221 07/08/17 0611 07/09/17 0622  WBC 16.9* 14.1* 7.9  HGB 15.5* 12.8 12.3  HCT 43.7 37.1 36.2  MCV 90.1 91.8 92.8  PLT 251 184 150   Basic Metabolic Panel: Recent Labs  Lab 07/07/17 1221 07/08/17 0611 07/09/17 0622  NA 139 138 141  K 3.7 2.8* 3.5  CL 105 109 114*  CO2 20* 20* 21*  GLUCOSE 119* 107* 107*  BUN 20 13 7   CREATININE 0.77 0.72 0.60  CALCIUM 9.8 8.2* 8.3*  MG  --  1.7  --    GFR: Estimated Creatinine Clearance: 86.5 mL/min (by C-G formula based on SCr of 0.6 mg/dL). Liver Function Tests: Recent Labs  Lab 07/07/17 1221 07/08/17 0611 07/08/17 1340 07/08/17 1902 07/09/17 0622  AST 153* 1,737* 1,650* 1,461* 702*  ALT 188* 1,967* 1,265* 2,556* 1,948*  ALKPHOS 67 41 42 46 41  BILITOT 2.3* 1.1 1.2 1.4* 1.6*  PROT 8.7* 6.0* 5.8* 6.4* 5.8*  ALBUMIN 5.1* 3.4* 3.3* 3.7 3.3*   Recent Labs  Lab 07/07/17 1221  LIPASE 21   Recent Labs  Lab 07/08/17 1902 07/09/17 0622  AMMONIA <9* 38*   Coagulation Profile: Recent Labs  Lab 07/07/17 1221 07/08/17 0611 07/08/17 1902 07/09/17 0622  INR 1.58 2.42 1.98 1.91   Cardiac Enzymes: No results for input(s): CKTOTAL, CKMB, CKMBINDEX, TROPONINI in the last 168 hours. BNP (last 3 results) No results for input(s): PROBNP in the last 8760 hours. HbA1C: No results for input(s): HGBA1C in the last 72 hours. CBG: No results for input(s): GLUCAP in the last 168 hours. Lipid Profile: No results for input(s): CHOL, HDL, LDLCALC, TRIG, CHOLHDL, LDLDIRECT in the last 72 hours. Thyroid Function Tests: No results for input(s): TSH, T4TOTAL, FREET4, T3FREE, THYROIDAB in the last 72 hours. Anemia Panel: No results for input(s): VITAMINB12, FOLATE, FERRITIN, TIBC, IRON, RETICCTPCT in the last 72 hours. Sepsis Labs: No results for input(s):  PROCALCITON, LATICACIDVEN in the last 168 hours.  No results found for this or any previous visit (from the past 240 hour(s)).       Radiology Studies: US Abdomen Limited Ruq  Result Date: 07/07/2017 CLINICAL DATA:  Nausea, vomiting, diarrhea since yesterday. EXAM: ULTRASOUND ABDOMEN LIMITED RIGHT UPPER QUADRANT COMPARISON:  None. FINDINGS: Gallbladder: No gallstones or wall thickening visualized. No sonographic Murphy sign noted by sonographer. Common bile duct: Diameter: 2 mm Liver: No focal lesion identified. Within normal limits in parenchymal echogenicity. Portal vein is patent on color Doppler imaging with normal direction of blood flow towards the liver. IMPRESSION: Normal right upper quadrant ultrasound. Electronically Signed   By: Elige Ko   On: 07/07/2017 14:18        Scheduled Meds: . amoxicillin-clavulanate  1 tablet Oral Q12H  . Influenza  vac split quadrivalent PF  0.5 mL Intramuscular Tomorrow-1000  . pantoprazole  40 mg Oral Daily   Continuous Infusions: . dextrose 5 % and 0.9 % NaCl with KCl 20 mEq/L 75 mL/hr at 07/08/17 2000     LOS: 2 days    Time spent: over 30 min    Lacretia Nicks, MD [facility] Pager [phone]  If 7PM-7AM, please contact night-coverage [email or web address] Password [facility] 07/09/2017, 8:13 AM

## 2017-07-09 NOTE — Progress Notes (Signed)
Follow up appointment at [facility],  Monday 07/22/17 at 1:00 PM.

## 2017-07-10 DIAGNOSIS — K047 Periapical abscess without sinus: Secondary | ICD-10-CM

## 2017-07-10 DIAGNOSIS — T391X1A Poisoning by 4-Aminophenol derivatives, accidental (unintentional), initial encounter: Secondary | ICD-10-CM

## 2017-07-10 LAB — CBC
HEMATOCRIT: 34.8 % — AB (ref 36.0–46.0)
HEMOGLOBIN: 11.7 g/dL — AB (ref 12.0–15.0)
MCH: 31.8 pg (ref 26.0–34.0)
MCHC: 33.6 g/dL (ref 30.0–36.0)
MCV: 94.6 fL (ref 78.0–100.0)
Platelets: 148 10*3/uL — ABNORMAL LOW (ref 150–400)
RBC: 3.68 MIL/uL — ABNORMAL LOW (ref 3.87–5.11)
RDW: 12.6 % (ref 11.5–15.5)
WBC: 8.3 10*3/uL (ref 4.0–10.5)

## 2017-07-10 LAB — BASIC METABOLIC PANEL
ANION GAP: 6 (ref 5–15)
BUN: 5 mg/dL — AB (ref 6–20)
CHLORIDE: 114 mmol/L — AB (ref 101–111)
CO2: 22 mmol/L (ref 22–32)
Calcium: 8.3 mg/dL — ABNORMAL LOW (ref 8.9–10.3)
Creatinine, Ser: 0.61 mg/dL (ref 0.44–1.00)
GFR calc Af Amer: >60 mL/min (ref >60)
GFR calc non Af Amer: >60 mL/min (ref >60)
GLUCOSE: 100 mg/dL — AB (ref 65–99)
POTASSIUM: 3.6 mmol/L (ref 3.5–5.1)
SODIUM: 142 mmol/L (ref 135–145)

## 2017-07-10 LAB — HEPATIC FUNCTION PANEL
ALBUMIN: 3 g/dL — AB (ref 3.5–5.0)
ALK PHOS: 44 U/L (ref 38–126)
ALT: 1140 U/L — AB (ref 14–54)
AST: 212 U/L — ABNORMAL HIGH (ref 15–41)
Bilirubin, Direct: 0.5 mg/dL (ref 0.1–0.5)
Indirect Bilirubin: 1.2 mg/dL — ABNORMAL HIGH (ref 0.3–0.9)
Total Bilirubin: 1.7 mg/dL — ABNORMAL HIGH (ref 0.3–1.2)
Total Protein: 5.2 g/dL — ABNORMAL LOW (ref 6.5–8.1)

## 2017-07-10 LAB — PROTIME-INR
INR: 1.41
Prothrombin Time: 17.1 seconds — ABNORMAL HIGH (ref 11.4–15.2)

## 2017-07-10 LAB — AMMONIA: AMMONIA: 39 umol/L — AB (ref 9–35)

## 2017-07-10 MED ORDER — PROCHLORPERAZINE EDISYLATE 5 MG/ML IJ SOLN: 10.0000 mg | Freq: Four times a day (QID) | INTRAMUSCULAR | Status: DC | PRN

## 2017-07-10 MED ORDER — METOCLOPRAMIDE HCL 5 MG/ML IJ SOLN: 10.0000 mg | Freq: Four times a day (QID) | INTRAMUSCULAR | Status: DC | PRN

## 2017-07-10 MED ORDER — AMOXICILLIN-POT CLAVULANATE 875-125 MG PO TABS: 1.0000 | ORAL_TABLET | Freq: Two times a day (BID) | ORAL | 0 refills | Status: DC

## 2017-07-10 MED ORDER — OXYCODONE HCL 5 MG PO TABS: 2.5000 mg | ORAL_TABLET | Freq: Three times a day (TID) | ORAL | 0 refills | Status: AC | PRN

## 2017-07-10 NOTE — Progress Notes (Signed)
Patient has discharged to home on 07/10/17. Discharge instructions including medications and appointments were given to patient. Patient has no question at this time.

## 2017-07-10 NOTE — Discharge Summary (Signed)
Physician Discharge Summary  Susan Beasley ZOX:[id number] DOB: June 09, 1995 DOA: 07/07/2017  PCP: Patient, No Pcp Per  Admit date: 07/07/2017 Discharge date: 07/10/2017  Recommendations for Outpatient Follow-up:  1. Needs close follow-up with dentist for treatment of dental infection 2. In regard to accidental Tylenol overdose, poison control did not recommend of any further testing 3. Modest thrombocytopenia, not felt to be clinically significant  Follow-up Information    [facility] Follow up on 07/22/2017.   Specialty:  Internal Medicine Why:  3/18 at 1:00 PM Please keep appointment.  Contact information: [address] [place] [address] [phone]           Discharge Diagnoses:  1. Accidental Tylenol overdose with associated nausea, vomiting, abdominal pain, elevated transaminases, total bilirubin, INR.  2. Suspected dental infection, left lower jaw molar  Discharge Condition: improved Disposition: home  Diet recommendation: regular  Filed Weights   07/07/17 1714 07/08/17 0621 07/09/17 0500  Weight: 54.4 kg (120 lb) 51.9 kg (114 lb 6.4 oz) 57.6 kg (127 lb)    History of present illness:  22 year old woman presented with nausea, vomiting, abdominal pain. urther evaluation revealed elevated LFTs and further history was notable for ongoing tooth pain for which the patient has been taking substantial quantities of Tylenol and Motrin.  Poison control recommended NAC, patient admitted for treatment of accidental Tylenol overdose.  Hospital Course:  Patient was treated with NAC in consultation with poison control center.  Transaminases and INR trended down and poison control recommended discontinuing NAC.  Just the case today with poison control and reviewed LFTs, ammonia level and INR levels with them.  They recommend no further treatment, no further monitoring and no need for outpatient follow-up for repeat blood testing.  Patient be discharged  home, individual issues as below.  Accidental Tylenol overdose with associated nausea, vomiting, abdominal pain, elevated transaminases, total bilirubin, INR.  Hepatitis panel negative.  Abdominal ultrasound and liver Doppler were unremarkable. --Nausea, vomiting, abdominal pain resolved. --Per poison control, NAC was discontinued 3/5 given improvement in LFTs.   Suspected dental infection, left lower jaw molar, with mild jaw swelling. --Afebrile, vital signs stable.  No evidence of severe infection or complicating features.  Discussed in detail with patient, will prescribe antibiotics on discharge and short course of pain medication.  I have reviewed the narcotic database and the patient was prescribed 5 tablets of Lortab in December.  Otherwise no prescriptions.  I reviewed with her that antibiotics will not cure this infection and that she needs to follow-up with a dentist in the next few days to prevent worsening and to obtain definitive management.  She clearly understood this. --Neither Tylenol or ibuprofen appropriate for pain at this point.   Today's assessment: S: Feels well.  No nausea, vomiting or abdominal pain.  The only complaint is left tooth pain which has been an ongoing problem in the outpatient setting.  Has a fair amount of pain when eating.  Some swelling of her left jaw. O: Vitals:  Vitals:   07/09/17 2036 07/10/17 0442  BP: (!) 107/47 (!) 103/52  Pulse: 71 72  Resp: 18 16  Temp: 97.9 F (36.6 C) 98 F (36.7 C)  SpO2: 100% 100%    Constitutional:  . Appears calm and comfortable ENMT:  . grossly normal hearing  . Left jaw somewhat swollen, nontender to palpation.  Left lower teeth appear unremarkable.  Gums appear unremarkable. Respiratory:  . CTA bilaterally, no w/r/r.  [name]  Respiratory effort normal Cardiovascular:  . RRR, no m/r/g Psychiatric:  . judgement and insight appear normal   Discharge Instructions   Allergies as of 07/10/2017   No Known  Allergies     Medication List    STOP taking these medications   acetaminophen 500 MG tablet Commonly known as:  TYLENOL   ibuprofen 200 MG tablet Commonly known as:  ADVIL,MOTRIN     TAKE these medications   amoxicillin-clavulanate 875-125 MG tablet Commonly known as:  AUGMENTIN Take 1 tablet by mouth every 12 (twelve) hours.   oxyCODONE 5 MG immediate release tablet Commonly known as:  Oxy IR/ROXICODONE Take 0.5 tablets (2.5 mg total) by mouth every 8 (eight) hours as needed for moderate pain.      No Known Allergies  The results of significant diagnostics from this hospitalization (including imaging, microbiology, ancillary and laboratory) are listed below for reference.    Significant Diagnostic Studies: US Liver Doppler  Result Date: 07/09/2017 CLINICAL DATA:  Elevated liver function tests. EXAM: DUPLEX ULTRASOUND OF LIVER TECHNIQUE: Color and duplex Doppler ultrasound was performed to evaluate the hepatic in-flow and out-flow vessels. COMPARISON:  None. FINDINGS: Portal Vein Velocities Main:  42.4 cm/sec Right:  21.4 cm/sec Left:  25 cm/sec Normal hepatopetal flow is noted in the portal veins. Hepatic Vein Velocities Right:  32.6 cm/sec Middle:  45.2 cm/sec Left:  29.7 cm/sec Normal hepatofugal flow is noted in the hepatic veins. Hepatic Artery Velocity:  61.2 cm/sec Splenic Vein Velocity:  36.6 cm/sec Varices: Absent. Ascites: Absent. Normal in size and appearance. IMPRESSION: There is no evidence of portal, hepatic or splenic venous thrombosis or occlusion. Electronically Signed   By: Lupita Raider, M.D.   On: 07/09/2017 16:19   US Abdomen Limited Ruq  Result Date: 07/07/2017 CLINICAL DATA:  Nausea, vomiting, diarrhea since yesterday. EXAM: ULTRASOUND ABDOMEN LIMITED RIGHT UPPER QUADRANT COMPARISON:  None. FINDINGS: Gallbladder: No gallstones or wall thickening visualized. No sonographic Murphy sign noted by sonographer. Common bile duct: Diameter: 2 mm Liver: No focal  lesion identified. Within normal limits in parenchymal echogenicity. Portal vein is patent on color Doppler imaging with normal direction of blood flow towards the liver. IMPRESSION: Normal right upper quadrant ultrasound. Electronically Signed   By: Elige Ko   On: 07/07/2017 14:18    Labs: Basic Metabolic Panel: Recent Labs  Lab 07/07/17 1221 07/08/17 0611 07/09/17 0622 07/10/17 0626  NA 139 138 141 142  K 3.7 2.8* 3.5 3.6  CL 105 109 114* 114*  CO2 20* 20* 21* 22  GLUCOSE 119* 107* 107* 100*  BUN 20 13 7  5*  CREATININE 0.77 0.72 0.60 0.61  CALCIUM 9.8 8.2* 8.3* 8.3*  MG  --  1.7  --   --    Liver Function Tests: Recent Labs  Lab 07/08/17 1340 07/08/17 1902 07/09/17 0622 07/09/17 1634 07/10/17 0626  AST 1,650* 1,461* 702* 433* 212*  ALT 1,265* 2,556* 1,948* 1,637* 1,140*  ALKPHOS 42 46 41 48 44  BILITOT 1.2 1.4* 1.6* 1.8* 1.7*  PROT 5.8* 6.4* 5.8* 5.9* 5.2*  ALBUMIN 3.3* 3.7 3.3* 3.5 3.0*   Recent Labs  Lab 07/07/17 1221  LIPASE 21   Recent Labs  Lab 07/08/17 1902 07/09/17 0622 07/10/17 0626  AMMONIA <9* 38* 39*   CBC: Recent Labs  Lab 07/07/17 1221 07/08/17 0611 07/09/17 0622 07/10/17 0626  WBC 16.9* 14.1* 7.9 8.3  HGB 15.5* 12.8 12.3 11.7*  HCT 43.7 37.1 36.2 34.8*  MCV 90.1 91.8 92.8  94.6  PLT 251 184 150 148*    Principal Problem:   Tylenol overdose, accidental or unintentional, initial encounter Active Problems:   Nausea & vomiting   Dental infection   Time coordinating discharge: 40 minutes  Signed:  Brendia Sacks, MD [facility] 07/10/2017, 1:54 PM

## 2017-07-22 ENCOUNTER — Ambulatory Visit: Payer: Self-pay | Admitting: Family Medicine

## 2017-10-17 ENCOUNTER — Emergency Department (HOSPITAL_BASED_OUTPATIENT_CLINIC_OR_DEPARTMENT_OTHER)
Admission: EM | Admit: 2017-10-17 | Discharge: 2017-10-17 | Disposition: A | Discharge status: Home / Self Care | Payer: Self-pay | Attending: Emergency Medicine | Admitting: Emergency Medicine

## 2017-10-17 ENCOUNTER — Encounter (HOSPITAL_BASED_OUTPATIENT_CLINIC_OR_DEPARTMENT_OTHER): Payer: Self-pay | Admitting: *Deleted

## 2017-10-17 ENCOUNTER — Other Ambulatory Visit: Payer: Self-pay

## 2017-10-17 DIAGNOSIS — Z79899 Other long term (current) drug therapy: Secondary | ICD-10-CM | POA: Insufficient documentation

## 2017-10-17 DIAGNOSIS — K047 Periapical abscess without sinus: Secondary | ICD-10-CM | POA: Insufficient documentation

## 2017-10-17 MED ORDER — PENICILLIN V POTASSIUM 500 MG PO TABS: 500.0000 mg | ORAL_TABLET | Freq: Four times a day (QID) | ORAL | 0 refills | Status: AC

## 2017-10-17 MED ORDER — NAPROXEN 500 MG PO TABS: 500.0000 mg | ORAL_TABLET | Freq: Two times a day (BID) | ORAL | 0 refills | Status: AC

## 2017-10-17 MED FILL — PENICILLIN VK 500 MG TABLET: 500 | 7 days supply | Qty: 27 | Fill #0

## 2017-10-17 MED FILL — NAPROXEN 500 MG TABLET: 500 | 7 days supply | Qty: 14 | Fill #0

## 2017-10-17 NOTE — Discharge Instructions (Signed)
Make appointment with dentist for next week. Take antibiotic as directed and the naprosyn for pain and swelling. Return for new or worse symptoms.

## 2017-10-17 NOTE — ED Triage Notes (Signed)
Possible dental abscess. Swelling to the left side of her face. Dental pain.

## 2017-10-17 NOTE — ED Notes (Signed)
ED Provider at bedside. 

## 2017-10-17 NOTE — ED Provider Notes (Signed)
[facility] EMERGENCY DEPARTMENT Provider Note   CSN: [id number] Arrival date & time: 10/17/17  1452     History   Chief Complaint Chief Complaint  Patient presents with  . Oral Swelling    HPI Susan Beasley is a 22 y.o. female.  Patient noted left-sided facial swelling down along the lower part of the jaw.  The skin itself is tender no tooth tenderness.  Noted this morning.  Several months ago did have an abscess to further back in that area that was actually extracted.  Patient states it does not hurt to put food in her mouth.  There is some discomfort with chewing but no tooth pain.     Past Medical History:  Diagnosis Date  . Ovarian cyst   . Peptic ulcer     Patient Active Problem List   Diagnosis Date Noted  . Tylenol overdose, accidental or unintentional, initial encounter 07/10/2017  . Dental infection 07/10/2017  . Nausea & vomiting 07/07/2017    History reviewed. No pertinent surgical history.   OB History   None      Home Medications    Prior to Admission medications   Medication Sig Start Date End Date Taking? Authorizing Provider  amoxicillin-clavulanate (AUGMENTIN) 875-125 MG tablet Take 1 tablet by mouth every 12 (twelve) hours. 07/10/17   Standley Brooking, MD  naproxen (NAPROSYN) 500 MG tablet Take 1 tablet (500 mg total) by mouth 2 (two) times daily. 10/17/17   Vanetta Mulders, MD  oxyCODONE (OXY IR/ROXICODONE) 5 MG immediate release tablet Take 0.5 tablets (2.5 mg total) by mouth every 8 (eight) hours as needed for moderate pain. 07/10/17   Standley Brooking, MD  penicillin v potassium (VEETID) 500 MG tablet Take 1 tablet (500 mg total) by mouth 4 (four) times daily for 7 days. 10/17/17 10/24/17  Vanetta Mulders, MD    Family History Family History  Problem Relation Age of Onset  . Hypertension Father     Social History Social History   Tobacco Use  . Smoking status: Never Smoker  . Smokeless tobacco: Never Used  Substance  Use Topics  . Alcohol use: No  . Drug use: No     Allergies   Patient has no known allergies.   Review of Systems Review of Systems  Constitutional: Negative for fever.  HENT: Positive for facial swelling. Negative for congestion, dental problem, sinus pain and trouble swallowing.   Eyes: Negative for visual disturbance.  Respiratory: Negative for shortness of breath.   Cardiovascular: Negative for chest pain.  Gastrointestinal: Negative for abdominal pain.  Genitourinary: Negative for dysuria.  Musculoskeletal: Positive for neck pain. Negative for neck stiffness.  Neurological: Negative for headaches.  Hematological: Does not bruise/bleed easily.  Psychiatric/Behavioral: Negative for confusion.     Physical Exam Updated Vital Signs BP (!) 112/58 (BP Location: Left Arm)   Pulse 84   Temp 98.3 F (36.8 C) (Oral)   Resp 18   Ht 1.575 m (5\' 2" )   Wt 54.4 kg (120 lb)   LMP 10/04/2017   SpO2 100%   BMI 21.95 kg/m   Physical Exam  Constitutional: She is oriented to person, place, and time. She appears well-developed and well-nourished. No distress.  HENT:  Head: Normocephalic and atraumatic.  Mouth/Throat: Oropharynx is clear and moist.  Swelling to the left side of the face down around the mandible area no erythema slight tenderness.  Some induration.  Examination of the teeth there is a tooth there with  a silver crown that did have a root canal according the patient in the past.  No gum swelling.  No erythema.  No swelling to the floor the mouth.  Eyes: Pupils are equal, round, and reactive to light. Conjunctivae and EOM are normal.  Neck: Normal range of motion. Neck supple.  Cardiovascular: Normal rate, regular rhythm and normal heart sounds.  Pulmonary/Chest: Effort normal and breath sounds normal. No respiratory distress.  Abdominal: Soft. Bowel sounds are normal. There is no tenderness.  Musculoskeletal: Normal range of motion.  Lymphadenopathy:    She has no  cervical adenopathy.  Neurological: She is alert and oriented to person, place, and time. No cranial nerve deficit or sensory deficit. She exhibits normal muscle tone. Coordination normal.  Skin: Skin is warm. No rash noted.  Nursing note and vitals reviewed.    ED Treatments / Results  Labs (all labs ordered are listed, but only abnormal results are displayed) Labs Reviewed - No data to display  EKG None  Radiology No results found.  Procedures Procedures (including critical care time)  Medications Ordered in ED Medications - No data to display   Initial Impression / Assessment and Plan / ED Course  I have reviewed the triage vital signs and the nursing notes.  Pertinent labs & imaging results that were available during my care of the patient were reviewed by me and considered in my medical decision making (see chart for details).     Patient with most likely an abscess tooth suspect the tooth that had the root canal in the crown the base of that tooth may have gone bad.  This may explain why there is no tenderness to palpation to the teeth.  We will treat as if it is an abscess tooth with penicillin and Naprosyn.  Patient does have a dentist to make an appointment to call to follow-up with next week.  She will return for any new or worse symptoms.  Do not think that this is salivary gland involvement.  Also there was no cervical adenopathy.  No trismus. No swelling to the floor the mouth.    Final Clinical Impressions(s) / ED Diagnoses   Final diagnoses:  Abscessed tooth    ED Discharge Orders        Ordered    naproxen (NAPROSYN) 500 MG tablet  2 times daily     10/17/17 1530    penicillin v potassium (VEETID) 500 MG tablet  4 times daily     10/17/17 1530       Vanetta Mulders, MD 10/17/17 1542

## 2017-12-22 IMAGING — CT CT HEAD W/O CM
3 series · 14 of 47 positions shown, 16 images · non-contrast
Comparison: None.

CLINICAL DATA: Headache and photosensitivity

EXAM:
CT HEAD WITHOUT CONTRAST
TECHNIQUE: Contiguous axial images were obtained from the base of the skull
through the vertex without intravenous contrast.

[Series 2: head wo · axial · 0.42mm/px · z∈[-179,-54]mm · 8 of 30 slices shown, 10 images]
[im 3/30  brain]
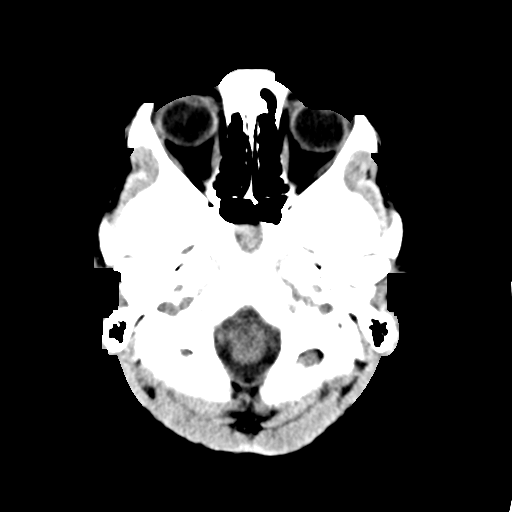
[im 3/30  bone]
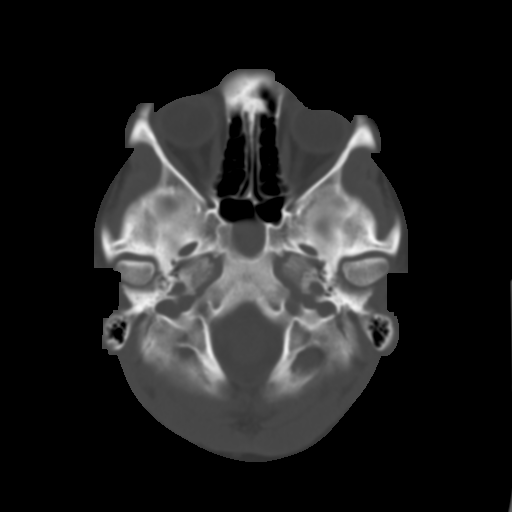
[im 7/30  brain]
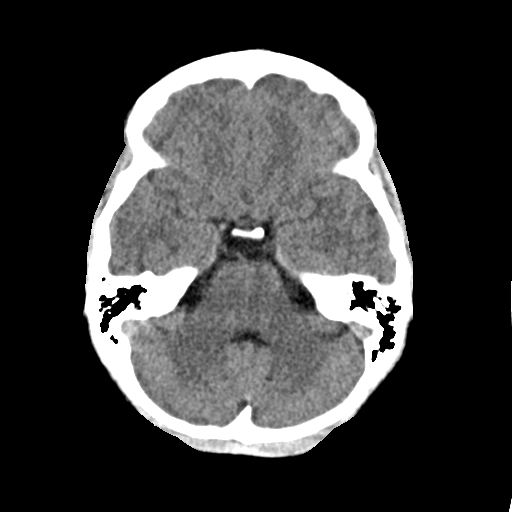
[im 10/30  brain]
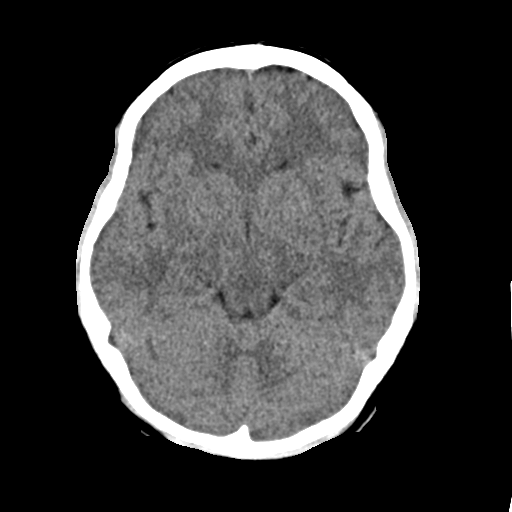
[im 14/30  brain]
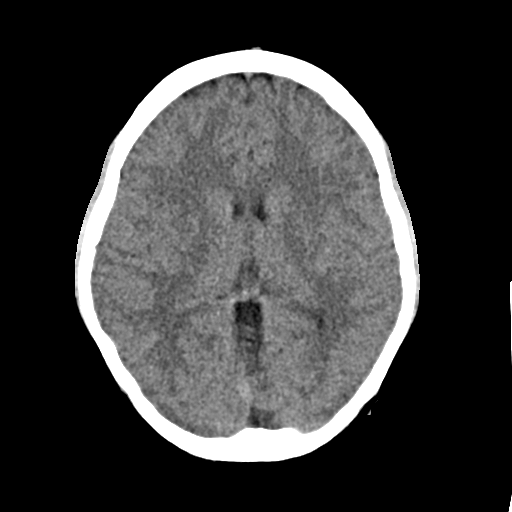
[im 17/30  brain]
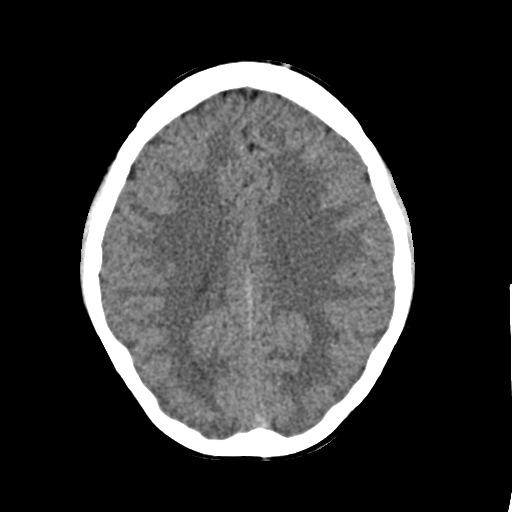
[im 17/30  bone]
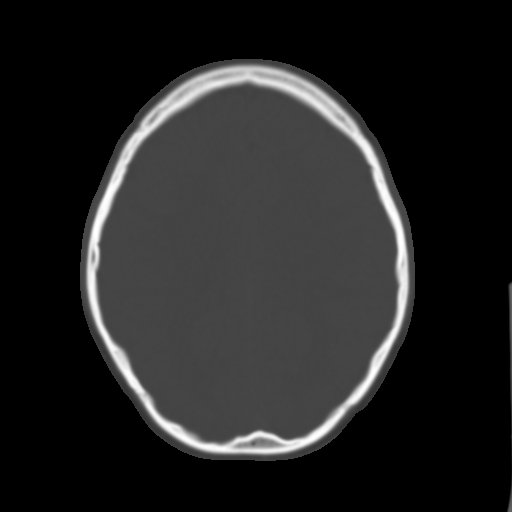
[im 21/30  brain]
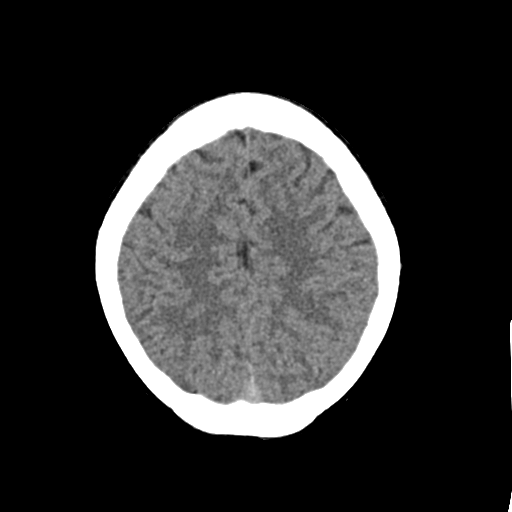
[im 24/30  brain]
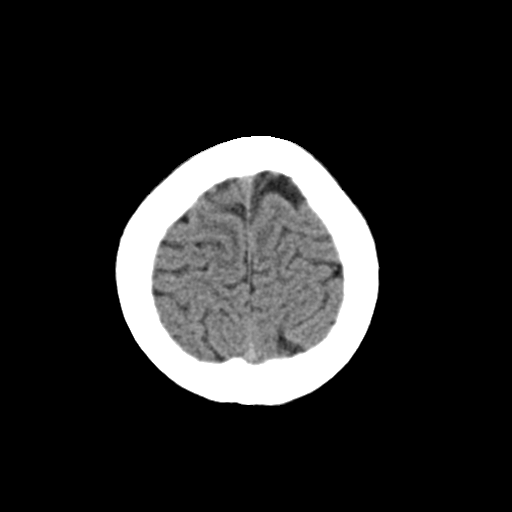
[im 28/30  brain]
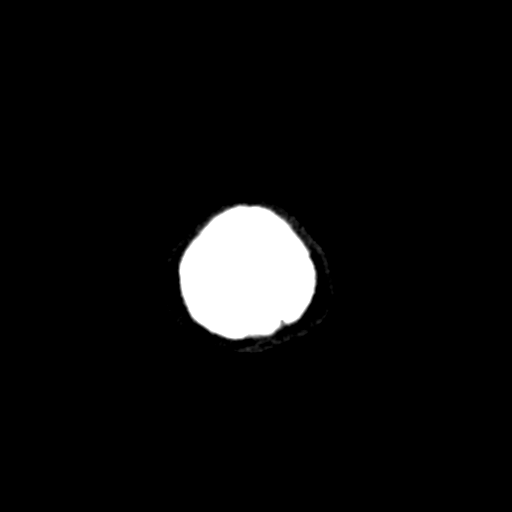

[Series 4: coronal soft · coronal · 0.30mm/px · 3 of 56 slices shown]
[im 19/56  brain]
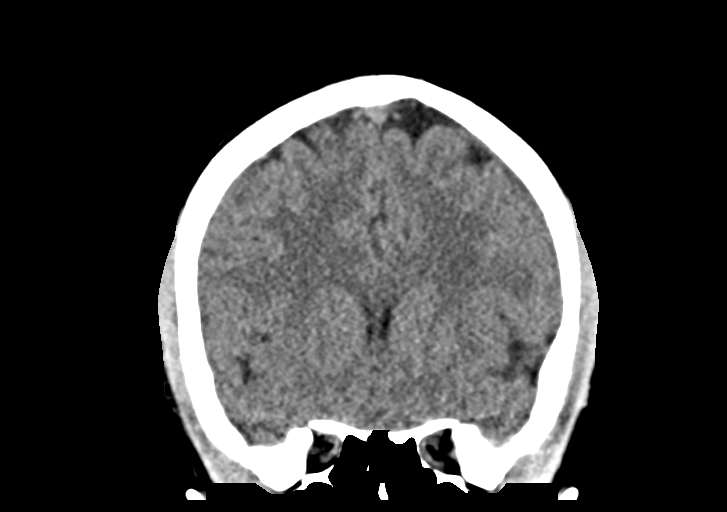
[im 25/56  brain]
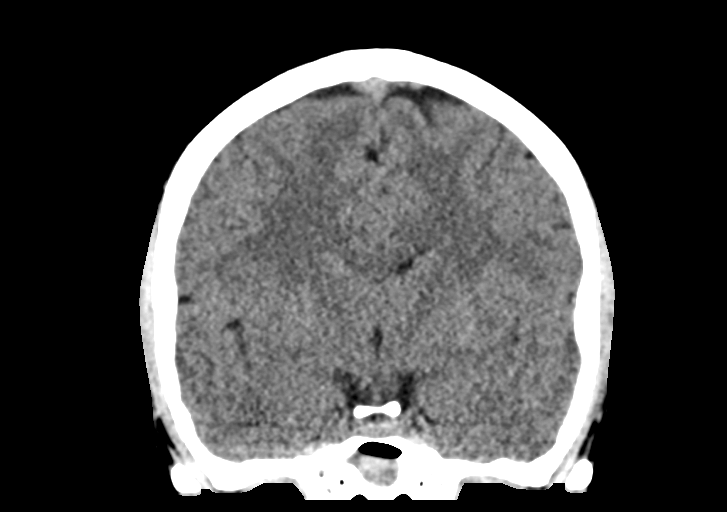
[im 31/56  brain]
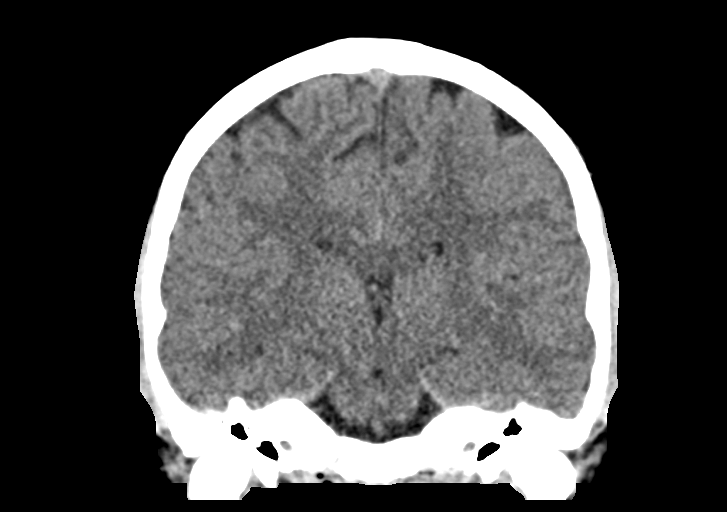

[Series 5: sag soft · sagittal · 0.30mm/px · 3 of 53 slices shown]
[im 18/53  brain]
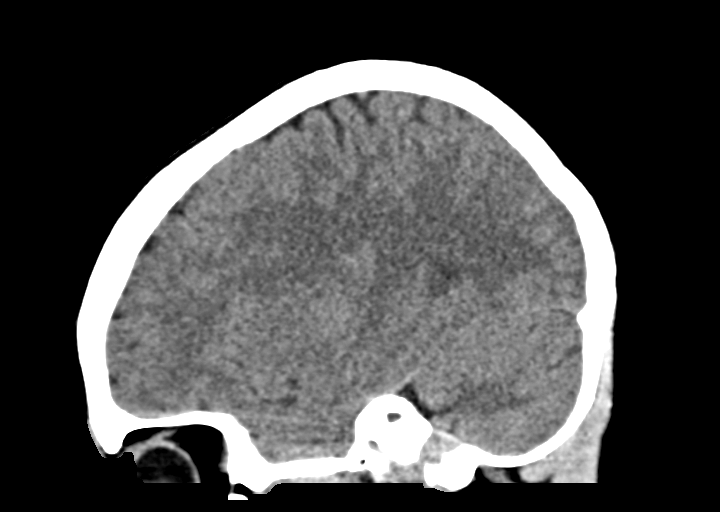
[im 27/53  brain]
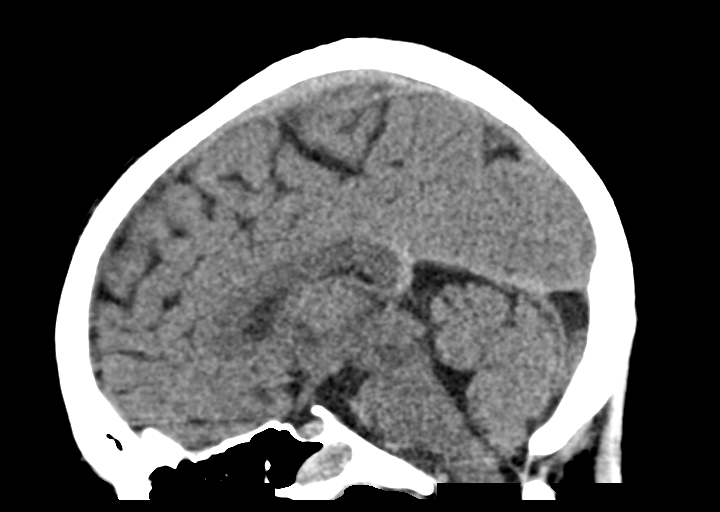
[im 35/53  brain]
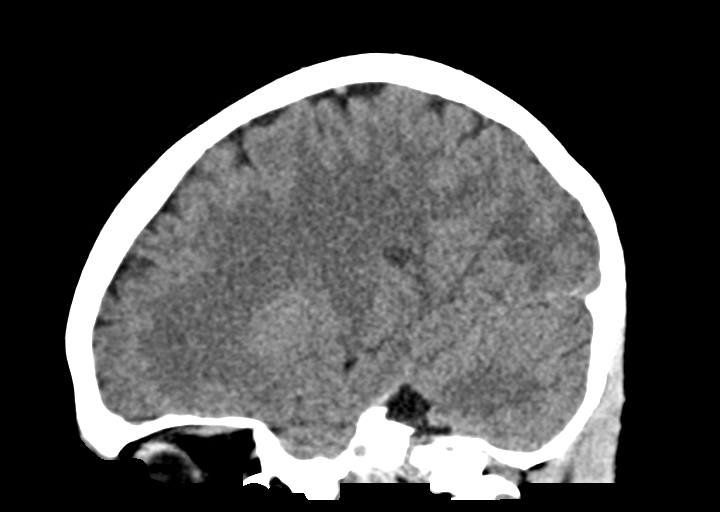

[14 of 47 positions shown; findings below may reference images not displayed]

FINDINGS: Brain: No mass lesion, intraparenchymal hemorrhage or extra-axial
collection. No evidence of acute cortical infarct. Brain parenchyma
and CSF-containing spaces are normal for age. There is no expansion
or abnormal hyperdensity of the visible dural venous sinuses. No
colloid cyst is identified. Normal position of the cerebellar
tonsils.

Vascular: No hyperdense vessel or atherosclerotic calcification.

Skull: Normal visualized skull base, calvarium and extracranial soft
tissues.

Sinuses/Orbits: No sinus fluid levels or advanced mucosal
thickening. No mastoid effusion. Normal orbits.
IMPRESSION: Normal head CT.

## 2018-04-19 ENCOUNTER — Other Ambulatory Visit: Payer: Self-pay

## 2018-04-19 ENCOUNTER — Emergency Department (HOSPITAL_BASED_OUTPATIENT_CLINIC_OR_DEPARTMENT_OTHER)
Admission: EM | Admit: 2018-04-19 | Discharge: 2018-04-20 | Disposition: A | Discharge status: Home / Self Care | Payer: Self-pay | Attending: Emergency Medicine | Admitting: Emergency Medicine

## 2018-04-19 ENCOUNTER — Encounter (HOSPITAL_BASED_OUTPATIENT_CLINIC_OR_DEPARTMENT_OTHER): Payer: Self-pay | Admitting: Emergency Medicine

## 2018-04-19 DIAGNOSIS — J029 Acute pharyngitis, unspecified: Secondary | ICD-10-CM | POA: Insufficient documentation

## 2018-04-19 DIAGNOSIS — Z5321 Procedure and treatment not carried out due to patient leaving prior to being seen by health care provider: Secondary | ICD-10-CM | POA: Insufficient documentation

## 2018-04-19 HISTORY — DX: Hepatic failure, unspecified without coma: K72.90

## 2018-04-19 LAB — GROUP A STREP BY PCR: Group A Strep by PCR: NOT DETECTED

## 2018-04-19 MED ORDER — IBUPROFEN 400 MG PO TABS
600.0000 mg | ORAL_TABLET | Freq: Once | ORAL | Status: AC
  Administered 2018-04-19: 600 mg via ORAL
  Filled 2018-04-19: qty 1

## 2018-04-19 NOTE — ED Triage Notes (Signed)
Sore throat and body aches today.

## 2018-08-23 IMAGING — US US ABDOMEN LIMITED
1 series · 14 of 25 positions shown · non-contrast
Comparison: None.

CLINICAL DATA: Nausea, vomiting, diarrhea since yesterday.

EXAM:
ULTRASOUND ABDOMEN LIMITED RIGHT UPPER QUADRANT

[Series 1: us abdomen limited · 0.15mm/px · 14 of 34 slices shown]
[im 1/34]
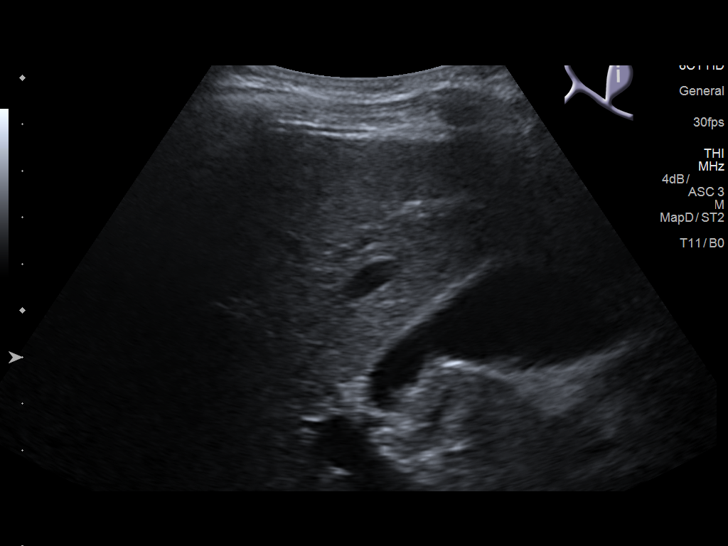
[im 3/34]
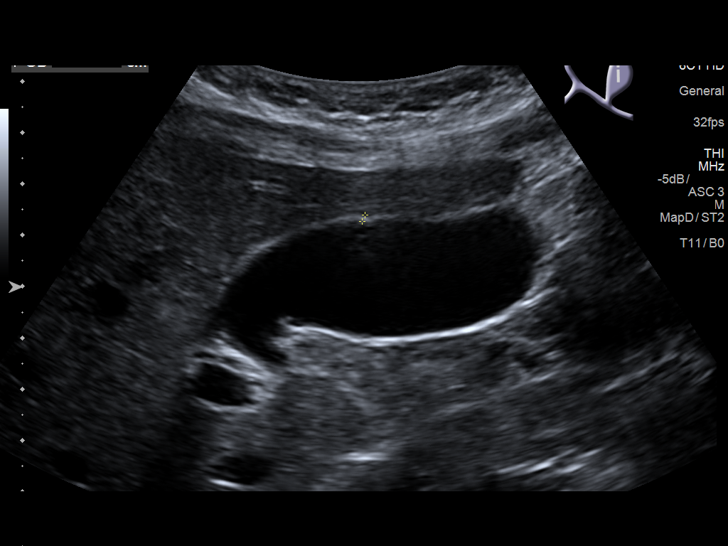
[im 6/34]
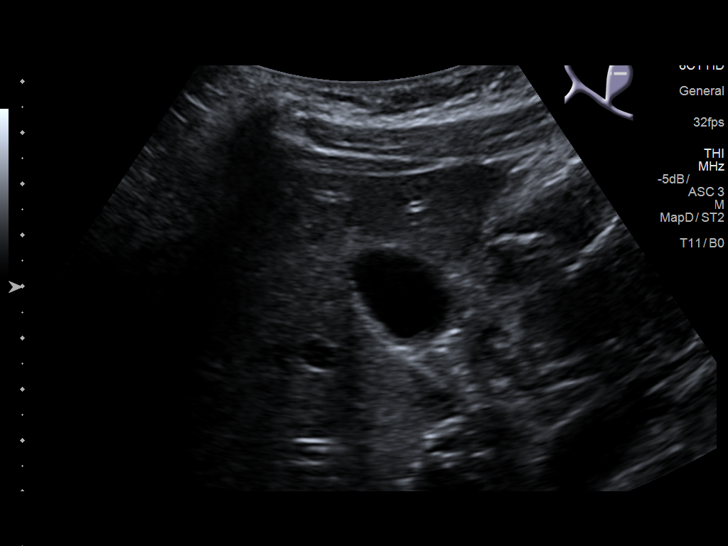
[im 9/34]
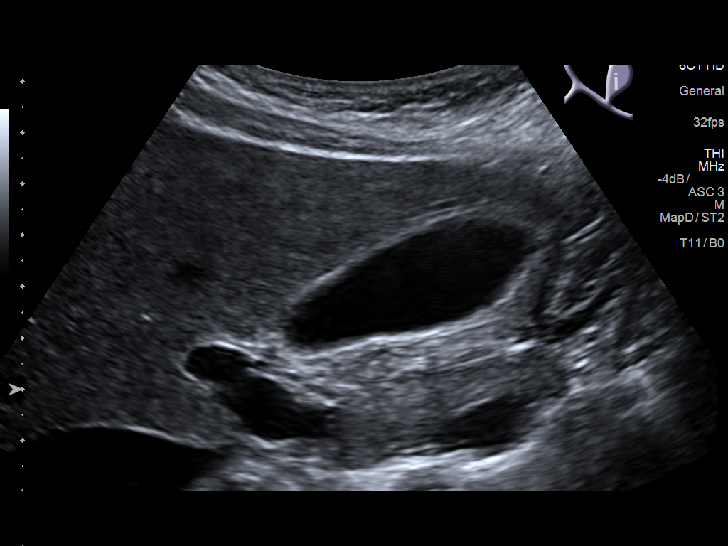
[im 12/34]
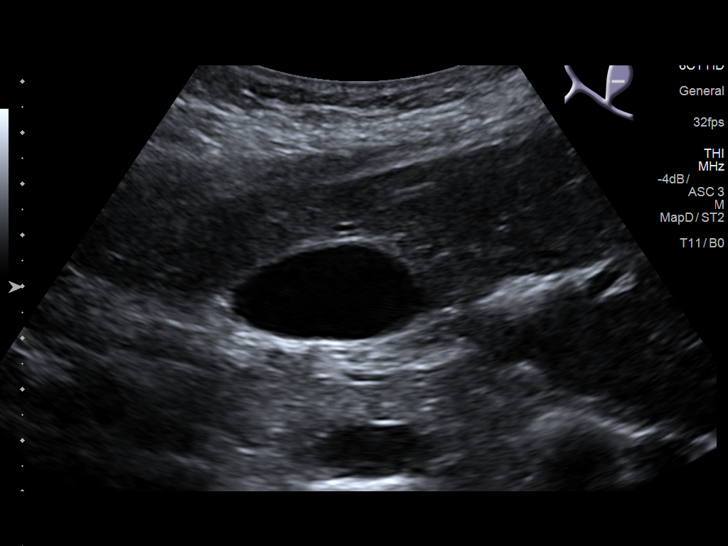
[im 13/34]
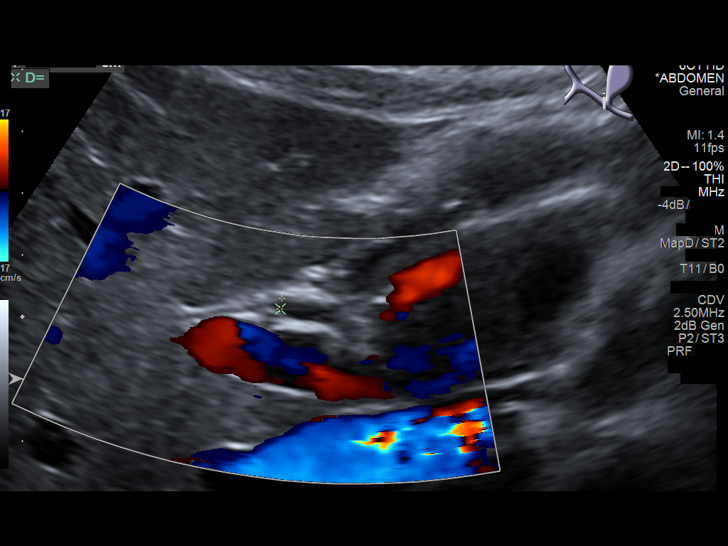
[im 16/34]
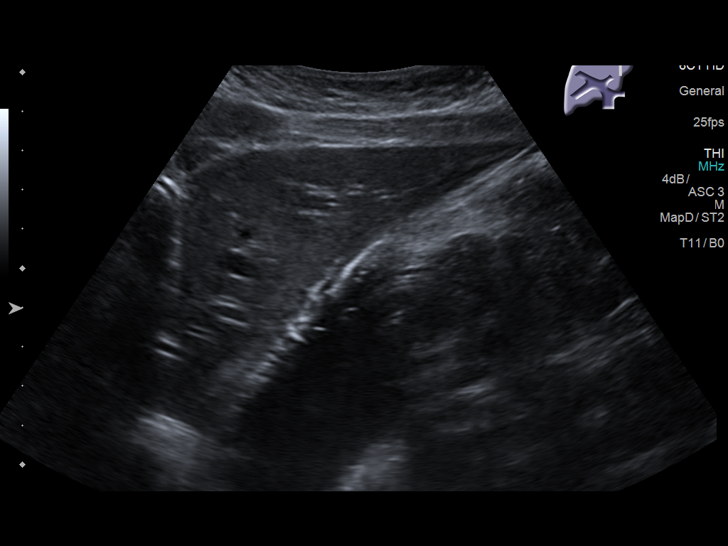
[im 18/34]
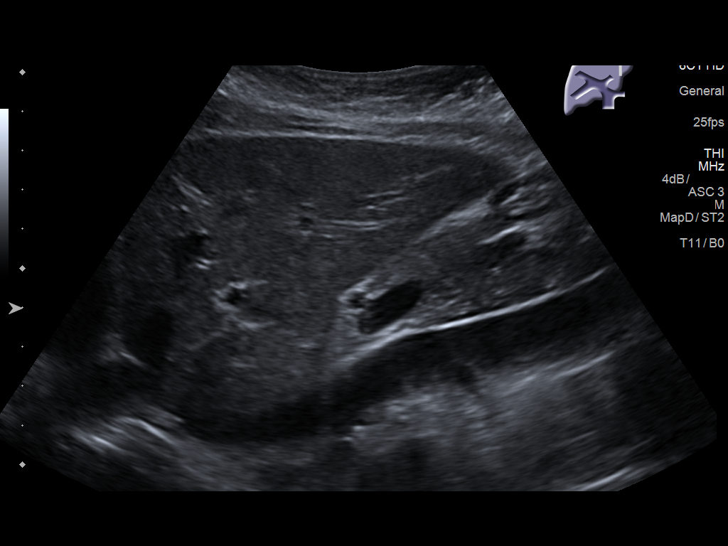
[im 21/34]
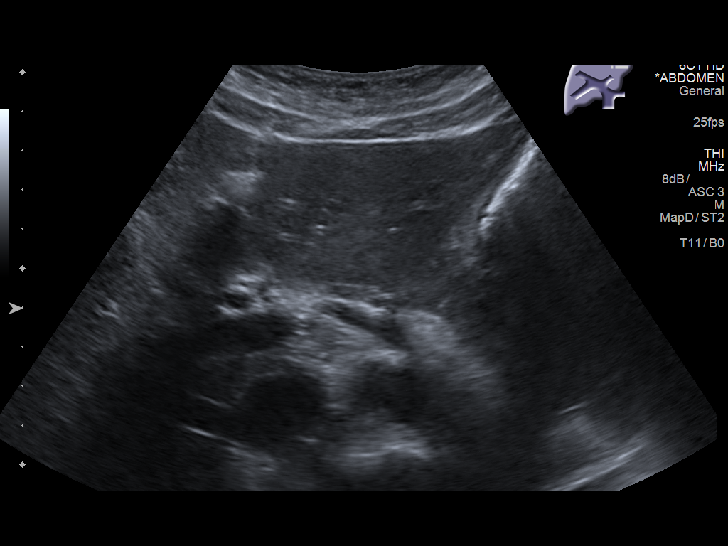
[im 23/34]
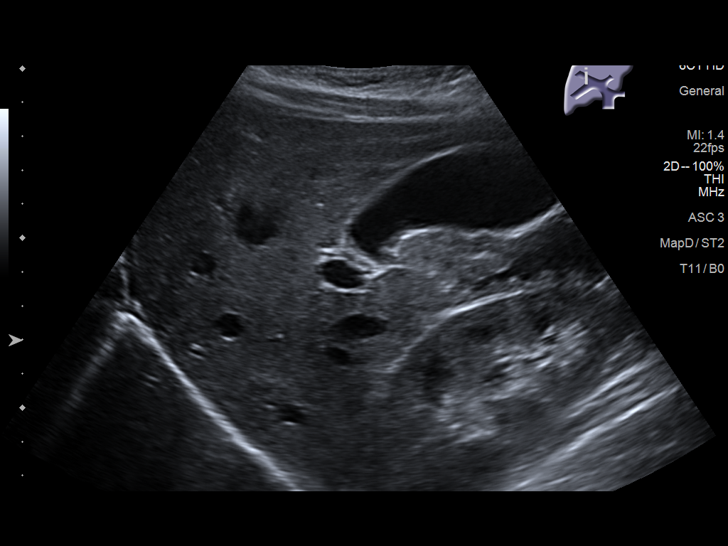
[im 25/34]
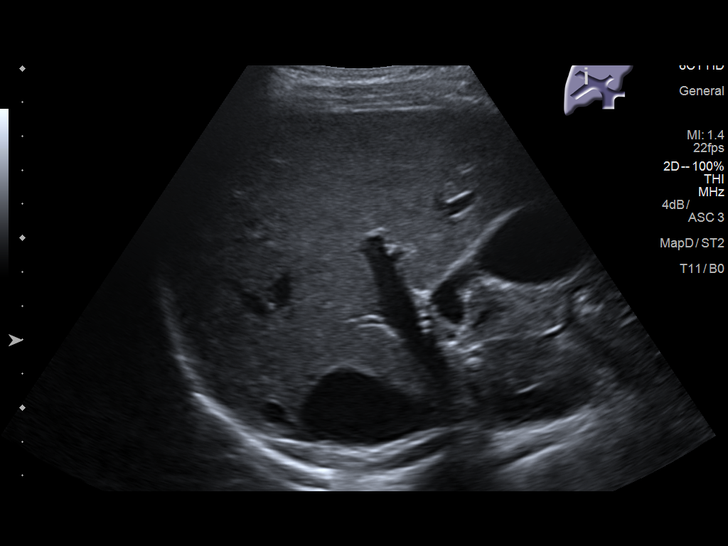
[im 28/34]
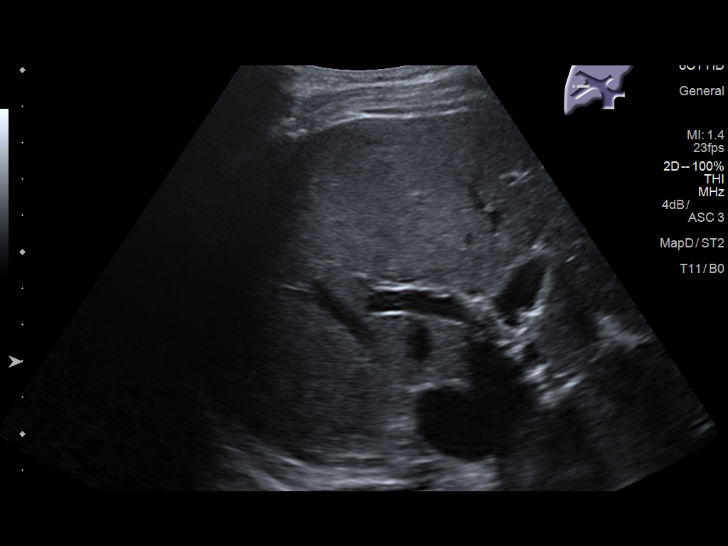
[im 31/34]
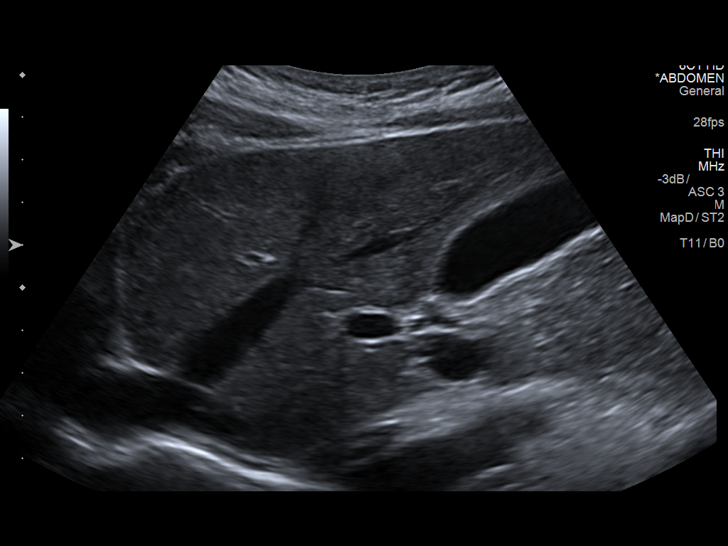
[im 34/34]
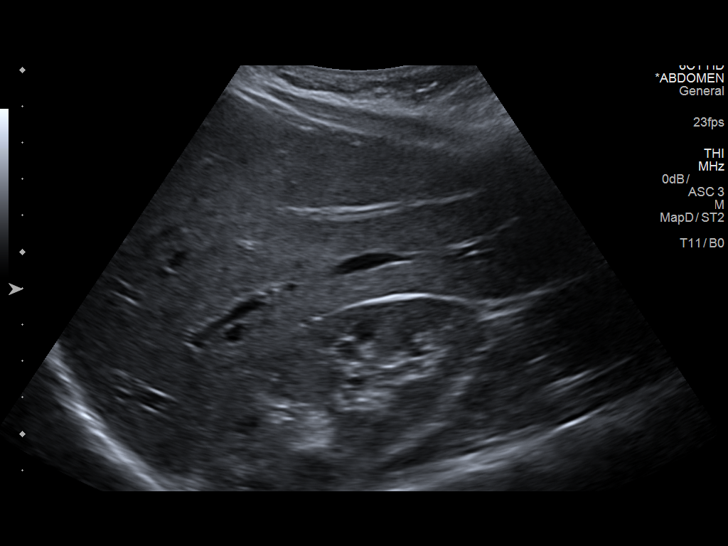

[14 of 25 positions shown; findings below may reference images not displayed]

FINDINGS: Gallbladder:

No gallstones or wall thickening visualized. No sonographic Murphy
sign noted by sonographer.

Common bile duct:

Diameter: 2 mm

Liver:

No focal lesion identified. Within normal limits in parenchymal
echogenicity. Portal vein is patent on color Doppler imaging with
normal direction of blood flow towards the liver.
IMPRESSION: Normal right upper quadrant ultrasound.

## 2018-08-25 IMAGING — US US HEPATIC LIVER DOPPLER
1 series · 14 of 25 positions shown · non-contrast
Comparison: None.

CLINICAL DATA: Elevated liver function tests.

EXAM:
DUPLEX ULTRASOUND OF LIVER
TECHNIQUE: Color and duplex Doppler ultrasound was performed to evaluate the
hepatic in-flow and out-flow vessels.

[Series 1: us hepatic liver doppler · 0.25mm/px · 14 of 34 slices shown]
[im 1/34]
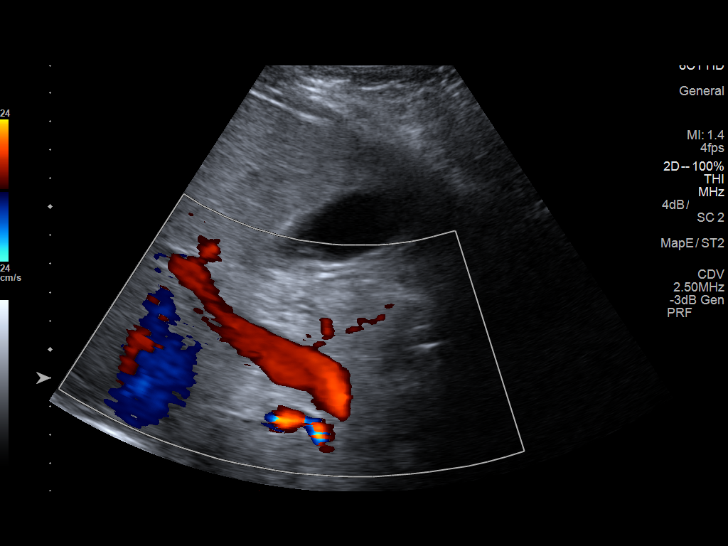
[im 3/34]
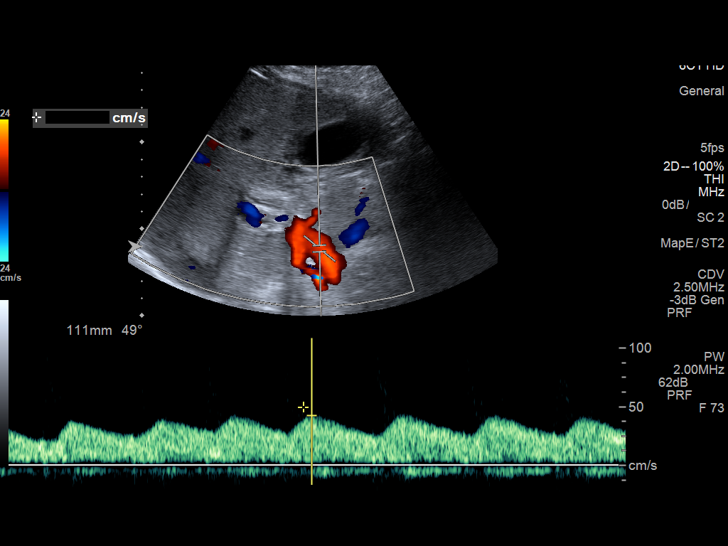
[im 6/34]
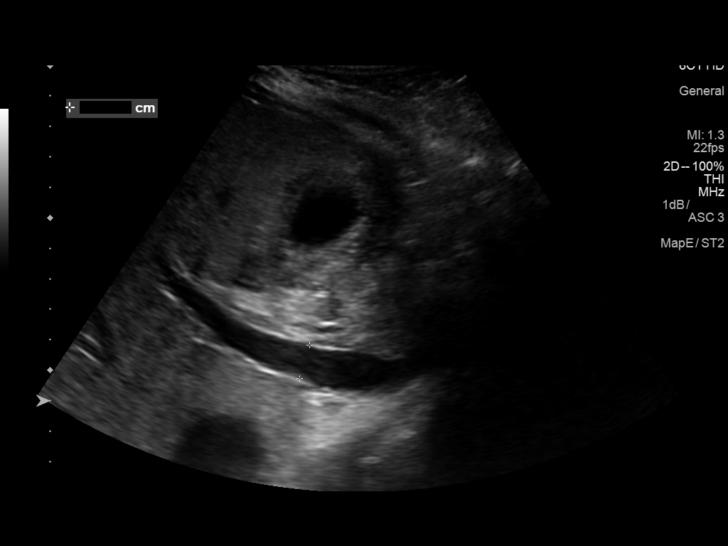
[im 9/34]
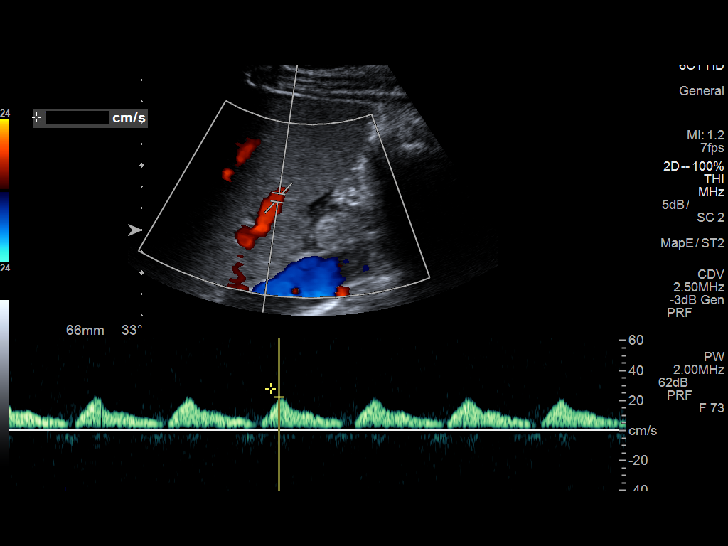
[im 12/34]
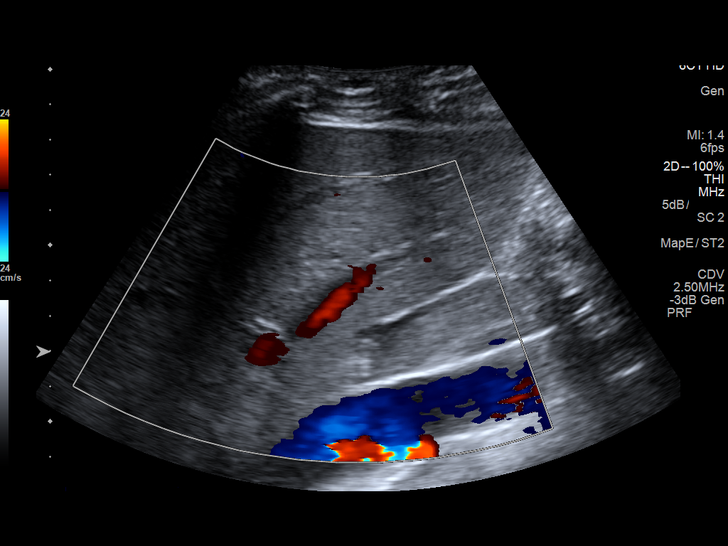
[im 13/34]
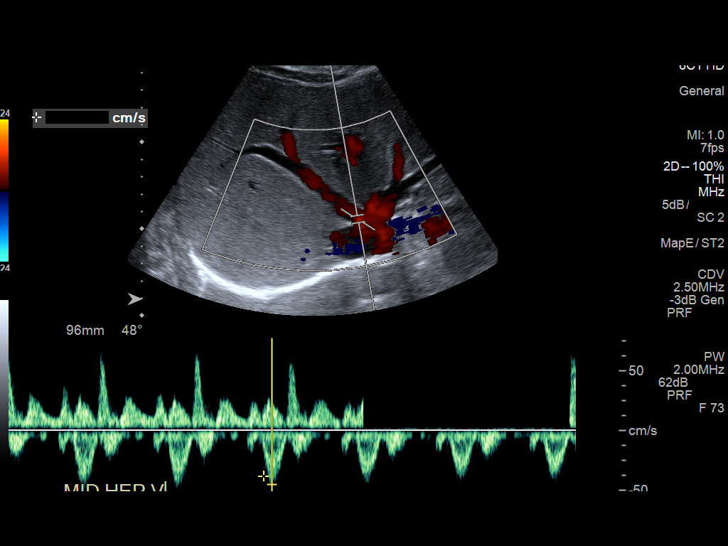
[im 16/34]
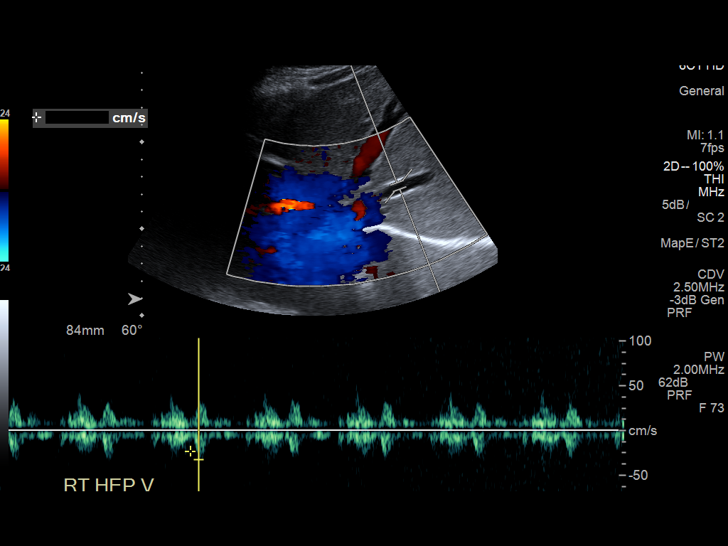
[im 18/34]
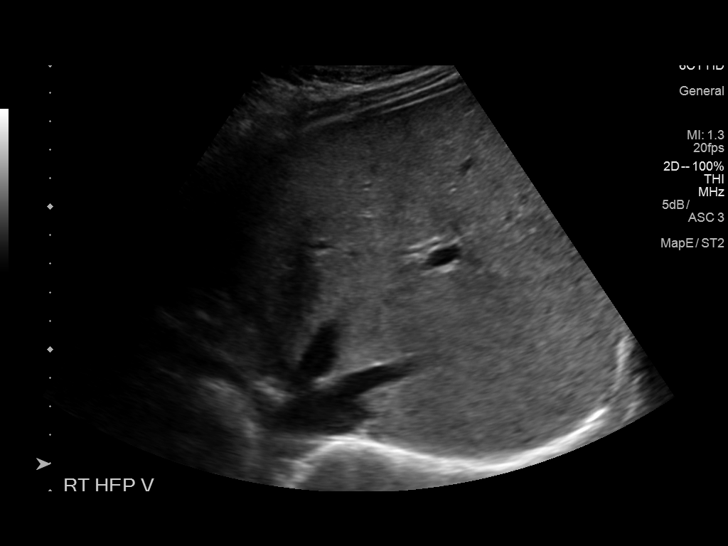
[im 21/34]
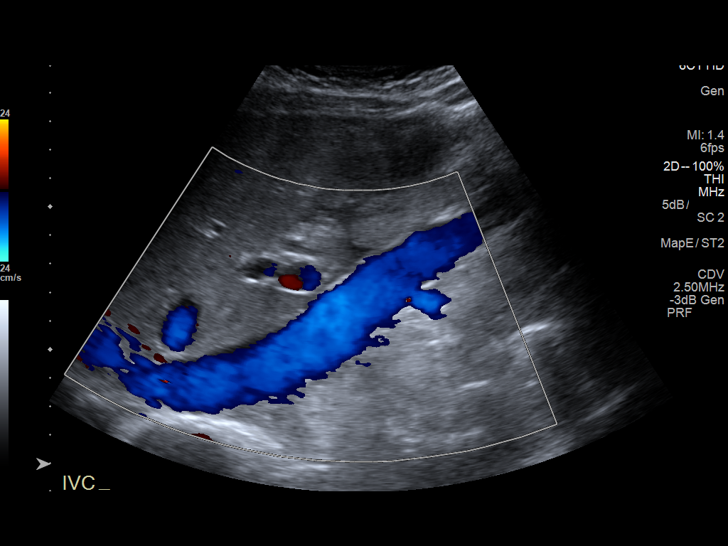
[im 23/34]
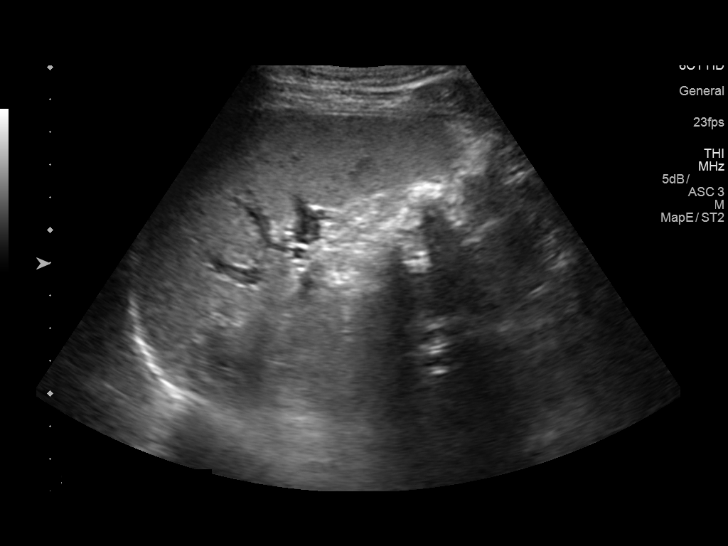
[im 25/34]
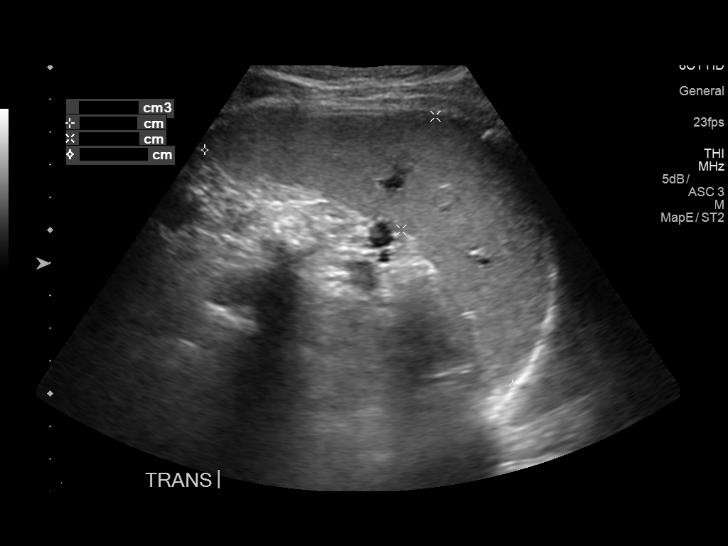
[im 28/34]
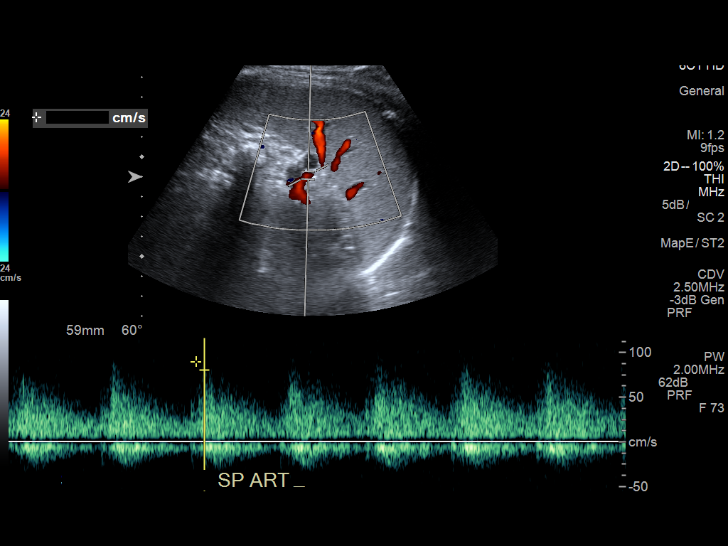
[im 31/34]
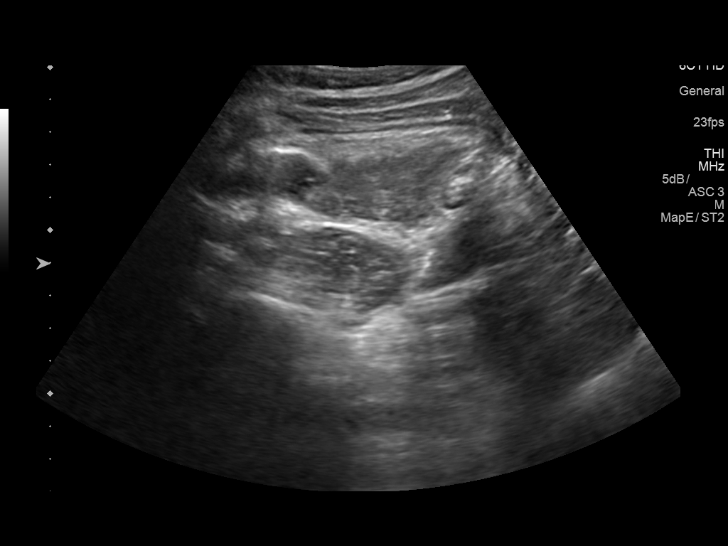
[im 34/34]
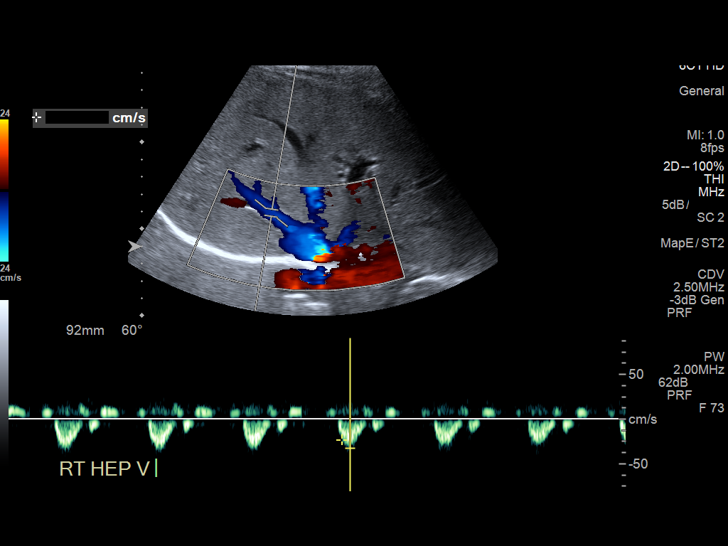

[14 of 25 positions shown; findings below may reference images not displayed]

FINDINGS: Portal Vein Velocities

Main:  42.4 cm/sec

Right:  21.4 cm/sec

Left:  25 cm/sec

Normal hepatopetal flow is noted in the portal veins.

Hepatic Vein Velocities

Right:  32.6 cm/sec

Middle:  45.2 cm/sec

Left:  29.7 cm/sec

Normal hepatofugal flow is noted in the hepatic veins.

Hepatic Artery Velocity:  61.2 cm/sec

Splenic Vein Velocity:  36.6 cm/sec

Varices: Absent.

Ascites: Absent.

Normal in size and appearance.
IMPRESSION: There is no evidence of portal, hepatic or splenic venous thrombosis
or occlusion.

## 2020-05-12 ENCOUNTER — Other Ambulatory Visit: Payer: Self-pay

## 2020-05-12 ENCOUNTER — Encounter (HOSPITAL_BASED_OUTPATIENT_CLINIC_OR_DEPARTMENT_OTHER): Payer: Self-pay | Admitting: *Deleted

## 2020-05-12 ENCOUNTER — Emergency Department (HOSPITAL_BASED_OUTPATIENT_CLINIC_OR_DEPARTMENT_OTHER)
Admission: EM | Admit: 2020-05-12 | Discharge: 2020-05-12 | Disposition: A | Discharge status: Home / Self Care | Payer: BLUE CROSS/BLUE SHIELD | Attending: Emergency Medicine | Admitting: Emergency Medicine

## 2020-05-12 DIAGNOSIS — M549 Dorsalgia, unspecified: Secondary | ICD-10-CM

## 2020-05-12 DIAGNOSIS — B349 Viral infection, unspecified: Secondary | ICD-10-CM

## 2020-05-12 DIAGNOSIS — U071 COVID-19: Secondary | ICD-10-CM | POA: Insufficient documentation

## 2020-05-12 DIAGNOSIS — R519 Headache, unspecified: Secondary | ICD-10-CM | POA: Diagnosis present

## 2020-05-12 HISTORY — DX: Depression, unspecified: F32.A

## 2020-05-12 HISTORY — DX: Post-traumatic stress disorder, unspecified: F43.10

## 2020-05-12 LAB — URINALYSIS, ROUTINE W REFLEX MICROSCOPIC
Bilirubin Urine: NEGATIVE
Glucose, UA: NEGATIVE mg/dL
Hgb urine dipstick: NEGATIVE
Ketones, ur: NEGATIVE mg/dL
Leukocytes,Ua: NEGATIVE
Nitrite: NEGATIVE
Protein, ur: NEGATIVE mg/dL
Specific Gravity, Urine: 1.015 (ref 1.005–1.030)
pH: 7.5 (ref 5.0–8.0)

## 2020-05-12 LAB — PREGNANCY, URINE: Preg Test, Ur: NEGATIVE

## 2020-05-12 MED ORDER — IBUPROFEN 400 MG PO TABS
400.0000 mg | ORAL_TABLET | Freq: Once | ORAL | Status: AC | PRN
  Administered 2020-05-12: 400 mg via ORAL
  Filled 2020-05-12: qty 1

## 2020-05-12 MED ORDER — ACETAMINOPHEN 500 MG PO TABS
1000.0000 mg | ORAL_TABLET | Freq: Once | ORAL | Status: AC
  Administered 2020-05-12: 1000 mg via ORAL
  Filled 2020-05-12: qty 2

## 2020-05-12 MED ORDER — IBUPROFEN 800 MG PO TABS: 800.0000 mg | ORAL_TABLET | Freq: Three times a day (TID) | ORAL | 0 refills | Status: AC

## 2020-05-12 MED ORDER — ONDANSETRON 4 MG PO TBDP
4.0000 mg | ORAL_TABLET | Freq: Once | ORAL | Status: AC
  Administered 2020-05-12: 4 mg via ORAL
  Filled 2020-05-12: qty 1

## 2020-05-12 NOTE — Discharge Instructions (Signed)
You were tested for COVID-19 today, make sure to quarantine until you receive the results. If this is positive, please make sure to quarantine additional 7 to 10 days. You may take ibuprofen/Tylenol for body aches and fevers, drink plenty of fluids, over-the-counter cold and flu medications. You may also buy an over-the-counter pulse oximeter which can be found at your local pharmacy. If your oxygen saturation drops below 90%, please make sure to return to the ER. Return to the ER for any worsening shortness of breath or any other new or worsening symptoms

## 2020-05-12 NOTE — ED Triage Notes (Signed)
generalized body aches h/a x 12 hrs

## 2020-05-12 NOTE — ED Provider Notes (Signed)
Medical screening examination/treatment/procedure(s) were conducted as a shared visit with non-physician practitioner(s) and myself.  I personally evaluated the patient during the encounter.    Patient reports she has had fever and aching in her back.  She has had headache as well.  No numbness or tingling of the extremities.  No weakness.  No bowel or bladder dysfunction.  No history of IV drug abuse.  Patient is alert and nontoxic.  No acute distress.  Movements coordinated purposeful symmetric.  No neurologic deficits.  No lower extremity edema.  Patient does not have risk factors for discitis or spinal abscess.  No neurologic dysfunction patient has been febrile.  No sign of UTI by urinalysis.  Clinically well in appearance.  I agree with proceeding with COVID swab, treating for probable viral illness return precautions reviewed   Arby Barrette, MD 05/14/20 0002

## 2020-05-12 NOTE — ED Notes (Signed)
C/o vomiting x2 in the lobby. Reports back pain.

## 2020-05-12 NOTE — ED Provider Notes (Signed)
[facility] EMERGENCY DEPARTMENT Provider Note   CSN: [id number] Arrival date & time: 05/12/20  1909     History Chief Complaint  Patient presents with  . covid sx    Susan Beasley is a 25 y.o. female.  HPI 25 year old female with a history of depression, ovarian cyst, peptic ulcer, PTSD, resents to the ER with complaints of low back ache and headache with chills which started this morning. Patient describes the backache as aching, aggressively been moving up her spine throughout the day. Denies any numbness or tingling in her lower extremities, states she has some tingling in her hands. No foot drop. No loss of bowel bladder control. No history of IV drug use. She states that she has felt feverish and did have a fever here in the ER. She is not vaccinated for Covid. No cough, abdominal pain. No dysuria or hematuria. No vaginal bleeding or discharge. She states that she has an ovarian cyst, wonders if it had ruptured. She however denies any lower abdominal pain, bleeding, vaginal discharge. Take with no vision changes, nausea, vomiting, syncope. No chest pain, no shortness of breath.   Past Medical History:  Diagnosis Date  . Depression   . Liver failure (HCC)   . Ovarian cyst   . Peptic ulcer   . PTSD (post-traumatic stress disorder)     Patient Active Problem List   Diagnosis Date Noted  . Tylenol overdose, accidental or unintentional, initial encounter 07/10/2017  . Dental infection 07/10/2017  . Nausea & vomiting 07/07/2017    History reviewed. No pertinent surgical history.   OB History   No obstetric history on file.     Family History  Problem Relation Age of Onset  . Hypertension Father     Social History   Tobacco Use  . Smoking status: Never Smoker  . Smokeless tobacco: Never Used  Substance Use Topics  . Alcohol use: No  . Drug use: No    Home Medications Prior to Admission medications   Medication Sig Start Date End Date Taking?  Authorizing Provider  ibuprofen (ADVIL) 800 MG tablet Take 1 tablet (800 mg total) by mouth 3 (three) times daily. 05/12/20  Yes Mare Ferrari, PA-C  amoxicillin-clavulanate (AUGMENTIN) 875-125 MG tablet Take 1 tablet by mouth every 12 (twelve) hours. 07/10/17   Standley Brooking, MD  naproxen (NAPROSYN) 500 MG tablet Take 1 tablet (500 mg total) by mouth 2 (two) times daily. 10/17/17   Vanetta Mulders, MD  oxyCODONE (OXY IR/ROXICODONE) 5 MG immediate release tablet Take 0.5 tablets (2.5 mg total) by mouth every 8 (eight) hours as needed for moderate pain. 07/10/17   Standley Brooking, MD    Allergies    Patient has no known allergies.  Review of Systems   Review of Systems  Constitutional: Positive for fever. Negative for chills.  HENT: Negative for ear pain and sore throat.   Eyes: Negative for pain and visual disturbance.  Respiratory: Negative for cough and shortness of breath.   Cardiovascular: Negative for chest pain and palpitations.  Gastrointestinal: Negative for abdominal pain and vomiting.  Genitourinary: Negative for dysuria and hematuria.  Musculoskeletal: Positive for back pain. Negative for arthralgias.  Skin: Negative for color change and rash.  Neurological: Positive for headaches. Negative for dizziness, seizures and syncope.  All other systems reviewed and are negative.   Physical Exam Updated Vital Signs BP (!) 107/56   Pulse (!) 110   Temp 100.1 F (37.8 C)  Resp 20   Ht 5\' 2"  (1.575 m)   Wt 65.8 kg   LMP 04/26/2020   SpO2 99%   BMI 26.52 kg/m   Physical Exam Vitals reviewed.  Constitutional:      General: She is not in acute distress.    Appearance: Normal appearance. She is not ill-appearing, toxic-appearing or diaphoretic.  HENT:     Head: Normocephalic.     Mouth/Throat:     Mouth: Mucous membranes are moist.     Pharynx: Oropharynx is clear.  Eyes:     Pupils: Pupils are equal, round, and reactive to light.  Cardiovascular:     Rate and  Rhythm: Normal rate and regular rhythm.     Pulses: Normal pulses.     Heart sounds: Normal heart sounds, S1 normal and S2 normal. No murmur heard. No friction rub.  Pulmonary:     Effort: Pulmonary effort is normal.     Breath sounds: Normal breath sounds.  Abdominal:     General: Abdomen is flat.     Palpations: Abdomen is soft.     Tenderness: There is no abdominal tenderness. There is no right CVA tenderness or left CVA tenderness.  Musculoskeletal:     Cervical back: Normal range of motion.     Right lower leg: No edema.     Left lower leg: No edema.     Comments: No midline tenderness to the C, T, L-spine. Mild tenderness to palpation to the paraspinal muscles of the T and L-spine. Moving all 4 extremities without difficulty. 5/5 strength in upper lower extremities bilaterally. Neurovascularly intact. No overlying skin changes, no step-offs or crepitus. Ambulated in the ED without difficulty, no evidence of foot drop.  Skin:    Findings: No erythema or rash.  Neurological:     General: No focal deficit present.     Mental Status: She is alert and oriented to person, place, and time.     Motor: No weakness.  Psychiatric:        Mood and Affect: Mood normal.        Behavior: Behavior normal.     ED Results / Procedures / Treatments   Labs (all labs ordered are listed, but only abnormal results are displayed) Labs Reviewed  SARS CORONAVIRUS 2 (TAT 6-24 HRS)  URINALYSIS, ROUTINE W REFLEX MICROSCOPIC  PREGNANCY, URINE    EKG None  Radiology No results found.  Procedures Procedures (including critical care time)  Medications Ordered in ED Medications  acetaminophen (TYLENOL) tablet 1,000 mg (1,000 mg Oral Given 05/12/20 1950)  ibuprofen (ADVIL) tablet 400 mg (400 mg Oral Given 05/12/20 2057)  ondansetron (ZOFRAN-ODT) disintegrating tablet 4 mg (4 mg Oral Given 05/12/20 2101)    ED Course  I have reviewed the triage vital signs and the nursing notes.  Pertinent labs &  imaging results that were available during my care of the patient were reviewed by me and considered in my medical decision making (see chart for details).    MDM Rules/Calculators/A&P                          Patient presents with back pain, headache in the setting of fever. Symptoms started today. No midline tenderness to the C, T, L-spine, no flank tenderness. Some paraspinal muscle tenderness on exam. No history of IV drug use, low suspicion for intraspinal abscess, cauda equina. Patient did present febrile with a temperature of 102.9, tachycardic with a pulse  of 145, she is anxious appearing on exam. Pulse improved to 110 here in the ED. Fever improved with Tylenol and ibuprofen. She denies any chest pain or shortness of breath, low suspicion for PE at this time. UA without evidence of UTI, low suspicion for pyelonephritis or renal stone.  Normal neuro exam, low suspicion for intracranial bleed.  Symptoms improved with ibuprofen.  Pregnancy test is negative. Patient was swabbed for Covid here. Given Zofran by nursing. Overall well-appearing.  Educated on supportive measures, quarantine period.  Will prescribe 800 mg of ibuprofen.  Return precautions discussed.  She voiced understanding and is agreeable. Final Clinical Impression(s) / ED Diagnoses Final diagnoses:  Viral syndrome  Back pain, unspecified back location, unspecified back pain laterality, unspecified chronicity    Rx / DC Orders ED Discharge Orders         Ordered    ibuprofen (ADVIL) 800 MG tablet  3 times daily        05/12/20 2231           Lyndel Safe 05/12/20 2305    Charlesetta Shanks, MD 05/14/20 0002

## 2020-05-13 LAB — SARS CORONAVIRUS 2 (TAT 6-24 HRS): SARS Coronavirus 2: POSITIVE — AB

## 2020-07-29 ENCOUNTER — Encounter (HOSPITAL_BASED_OUTPATIENT_CLINIC_OR_DEPARTMENT_OTHER): Payer: Self-pay

## 2020-07-29 ENCOUNTER — Emergency Department (HOSPITAL_BASED_OUTPATIENT_CLINIC_OR_DEPARTMENT_OTHER)
Admission: EM | Admit: 2020-07-29 | Discharge: 2020-07-29 | Disposition: A | Discharge status: Home / Self Care | Payer: BLUE CROSS/BLUE SHIELD | Attending: Emergency Medicine | Admitting: Emergency Medicine

## 2020-07-29 ENCOUNTER — Other Ambulatory Visit: Payer: Self-pay

## 2020-07-29 DIAGNOSIS — J069 Acute upper respiratory infection, unspecified: Secondary | ICD-10-CM | POA: Diagnosis not present

## 2020-07-29 DIAGNOSIS — J029 Acute pharyngitis, unspecified: Secondary | ICD-10-CM | POA: Diagnosis present

## 2020-07-29 LAB — RAPID INFLUENZA A&B ANTIGENS
Influenza A (ARMC): NEGATIVE
Influenza B (ARMC): NEGATIVE

## 2020-07-29 LAB — GROUP A STREP BY PCR: Group A Strep by PCR: NOT DETECTED

## 2020-07-29 MED ORDER — BENZONATATE 200 MG PO CAPS: 200.0000 mg | ORAL_CAPSULE | Freq: Three times a day (TID) | ORAL | 0 refills | Status: AC

## 2020-07-29 MED ORDER — IBUPROFEN 800 MG PO TABS
800.0000 mg | ORAL_TABLET | Freq: Once | ORAL | Status: AC
  Administered 2020-07-29: 800 mg via ORAL
  Filled 2020-07-29: qty 1

## 2020-07-29 MED ORDER — ONDANSETRON 4 MG PO TBDP
8.0000 mg | ORAL_TABLET | Freq: Once | ORAL | Status: AC
  Administered 2020-07-29: 8 mg via ORAL
  Filled 2020-07-29: qty 2

## 2020-07-29 NOTE — ED Provider Notes (Signed)
[facility] EMERGENCY DEPARTMENT Provider Note   CSN: [id number] Arrival date & time: 07/29/20  1639     History Chief Complaint  Patient presents with  . Sore Throat    Woke up this am with very bad sore throat.  [name] Headache    Headache since last night    Susan Beasley is a 25 y.o. female.  25 year old female with complaint of sore throat, headache, back ache, nausea and cough. Symptoms started yesterday, was exposed to a friend who recently has strep and a URI. Patient had COVID in 05/2020, is not vaccinated. No other complaints or concerns.  [name] was evaluated in Emergency Department on 07/29/2020 for the symptoms described in the history of present illness. She was evaluated in the context of the global COVID-19 pandemic, which necessitated consideration that the patient might be at risk for infection with the SARS-CoV-2 virus that causes COVID-19. Institutional protocols and algorithms that pertain to the evaluation of patients at risk for COVID-19 are in a state of rapid change based on information released by regulatory bodies including the CDC and federal and state organizations. These policies and algorithms were followed during the patient's care in the ED.         Past Medical History:  Diagnosis Date  . Depression   . Liver failure (HCC)   . Ovarian cyst   . Peptic ulcer   . PTSD (post-traumatic stress disorder)     Patient Active Problem List   Diagnosis Date Noted  . Tylenol overdose, accidental or unintentional, initial encounter 07/10/2017  . Dental infection 07/10/2017  . Nausea & vomiting 07/07/2017    No past surgical history on file.   OB History   No obstetric history on file.     Family History  Problem Relation Age of Onset  . Hypertension Father     Social History   Tobacco Use  . Smoking status: Never Smoker  . Smokeless tobacco: Never Used  Substance Use Topics  . Alcohol use: No  . Drug use: No    Home  Medications Prior to Admission medications   Medication Sig Start Date End Date Taking? Authorizing Provider  benzonatate (TESSALON) 200 MG capsule Take 1 capsule (200 mg total) by mouth every 8 (eight) hours for 10 days. 07/29/20 08/08/20 Yes Jeannie Fend, PA-C  amoxicillin-clavulanate (AUGMENTIN) 875-125 MG tablet Take 1 tablet by mouth every 12 (twelve) hours. 07/10/17   Standley Brooking, MD  ibuprofen (ADVIL) 800 MG tablet Take 1 tablet (800 mg total) by mouth 3 (three) times daily. 05/12/20   Mare Ferrari, PA-C  naproxen (NAPROSYN) 500 MG tablet Take 1 tablet (500 mg total) by mouth 2 (two) times daily. 10/17/17   Vanetta Mulders, MD  oxyCODONE (OXY IR/ROXICODONE) 5 MG immediate release tablet Take 0.5 tablets (2.5 mg total) by mouth every 8 (eight) hours as needed for moderate pain. 07/10/17   Standley Brooking, MD    Allergies    Patient has no known allergies.  Review of Systems   Review of Systems  Constitutional: Negative for chills and fever.  HENT: Positive for sore throat. Negative for congestion, sinus pressure, sinus pain, sneezing, trouble swallowing and voice change.   Respiratory: Positive for cough.   Gastrointestinal: Positive for nausea. Negative for vomiting.  Genitourinary: Negative for dysuria.  Musculoskeletal: Positive for back pain. Negative for arthralgias and myalgias.  Skin: Negative for rash and wound.  Allergic/Immunologic: Negative for immunocompromised state.  Neurological: Positive for headaches.  Hematological: Negative for adenopathy.  All other systems reviewed and are negative.   Physical Exam Updated Vital Signs BP 109/70 (BP Location: Right Arm)   Pulse 95   Temp 98.4 F (36.9 C) (Oral)   Resp 18   Ht 5\' 2"  (1.575 m)   Wt 65.8 kg   LMP 07/18/2020   SpO2 100%   BMI 26.52 kg/m   Physical Exam Vitals and nursing note reviewed.  Constitutional:      General: She is not in acute distress.    Appearance: She is well-developed. She is  not diaphoretic.  HENT:     Head: Normocephalic and atraumatic.     Right Ear: Tympanic membrane and ear canal normal.     Left Ear: Tympanic membrane and ear canal normal.     Mouth/Throat:     Mouth: Mucous membranes are moist.     Pharynx: No pharyngeal swelling, oropharyngeal exudate, posterior oropharyngeal erythema or uvula swelling.     Tonsils: No tonsillar exudate or tonsillar abscesses. 1+ on the right. 1+ on the left.  Eyes:     Conjunctiva/sclera: Conjunctivae normal.  Cardiovascular:     Rate and Rhythm: Normal rate and regular rhythm.     Heart sounds: Normal heart sounds.  Pulmonary:     Effort: Pulmonary effort is normal.     Breath sounds: Normal breath sounds.  Abdominal:     Tenderness: There is no right CVA tenderness or left CVA tenderness.  Musculoskeletal:     Cervical back: Neck supple.  Lymphadenopathy:     Cervical: No cervical adenopathy.  Skin:    General: Skin is warm and dry.     Findings: No erythema or rash.  Neurological:     Mental Status: She is alert and oriented to person, place, and time.  Psychiatric:        Behavior: Behavior normal.     ED Results / Procedures / Treatments   Labs (all labs ordered are listed, but only abnormal results are displayed) Labs Reviewed  GROUP A STREP BY PCR  RAPID INFLUENZA A&B ANTIGENS    EKG None  Radiology No results found.  Procedures Procedures   Medications Ordered in ED Medications  ondansetron (ZOFRAN-ODT) disintegrating tablet 8 mg (8 mg Oral Given 07/29/20 1800)  ibuprofen (ADVIL) tablet 800 mg (800 mg Oral Given 07/29/20 1800)    ED Course  I have reviewed the triage vital signs and the nursing notes.  Pertinent labs & imaging results that were available during my care of the patient were reviewed by me and considered in my medical decision making (see chart for details).  Clinical Course as of 07/29/20 1844  07/31/20 Jul 29, 2020  [id number] 25 year old female with complaint of headache,  sore throat, cough and symptoms as above.  Exam is unremarkable, tonsils appear normal, no cervical lymphadenopathy. Patient is flu and strep negative.  Covid not obtained due to history of positive test in the past 90 days. Recommend Motrin and Tylenol as needed as directed for symptoms, given prescription for Tessalon to use as prescribed for cough. [LM]    Clinical Course User Index [LM] 30   MDM Rules/Calculators/A&P                          Final Clinical Impression(s) / ED Diagnoses Final diagnoses:  Viral upper respiratory tract infection    Rx / DC Orders ED  Discharge Orders         Ordered    benzonatate (TESSALON) 200 MG capsule  Every 8 hours        07/29/20 1843           Jeannie Fend, PA-C 07/29/20 1844    Terrilee Files, MD 07/30/20 1101

## 2020-07-29 NOTE — Discharge Instructions (Addendum)
Your strep and flu tests are negative today.  You were not tested for Covid today due to a positive Covid test in January (PCR testing is not recommended within 90 days of prior positive test).  Recommend Motrin and Tylenol for your symptoms.  Prescription given for Tessalon which you can take for cough as needed as prescribed.

## 2021-05-14 ENCOUNTER — Emergency Department (HOSPITAL_BASED_OUTPATIENT_CLINIC_OR_DEPARTMENT_OTHER)
Admission: EM | Admit: 2021-05-14 | Discharge: 2021-05-14 | Disposition: A | Discharge status: Home / Self Care | Payer: BLUE CROSS/BLUE SHIELD | Attending: Emergency Medicine | Admitting: Emergency Medicine

## 2021-05-14 ENCOUNTER — Encounter (HOSPITAL_BASED_OUTPATIENT_CLINIC_OR_DEPARTMENT_OTHER): Payer: Self-pay | Admitting: Emergency Medicine

## 2021-05-14 ENCOUNTER — Other Ambulatory Visit: Payer: Self-pay

## 2021-05-14 DIAGNOSIS — K0889 Other specified disorders of teeth and supporting structures: Secondary | ICD-10-CM | POA: Insufficient documentation

## 2021-05-14 DIAGNOSIS — R110 Nausea: Secondary | ICD-10-CM | POA: Diagnosis not present

## 2021-05-14 DIAGNOSIS — K029 Dental caries, unspecified: Secondary | ICD-10-CM | POA: Diagnosis not present

## 2021-05-14 MED ORDER — PENICILLIN V POTASSIUM 500 MG PO TABS: 500.0000 mg | ORAL_TABLET | Freq: Four times a day (QID) | ORAL | 0 refills | Status: AC

## 2021-05-14 MED ORDER — PENICILLIN V POTASSIUM 250 MG PO TABS
500.0000 mg | ORAL_TABLET | Freq: Once | ORAL | Status: AC
Start: 2021-05-14 — End: 2021-05-14
  Administered 2021-05-14: 500 mg via ORAL
  Filled 2021-05-14: qty 2

## 2021-05-14 NOTE — Discharge Instructions (Addendum)
It was a pleasure taking care of you today!   You will be prescribed Penicillin, take as directed and ensure to complete the entire course of the antibiotic. You may take over the counter 600 mg Ibuprofen every 6 hours or 1,000 mg Tylenol every 6 hours as directed.  Call your dentist and notify them of your recent ED visit to schedule a follow-up appointment.  Return to the Emergency Department if you are experiencing trouble breathing, trouble swallowing, decreased fluid intake, chest pain, or worsening symptoms.

## 2021-05-14 NOTE — ED Provider Notes (Signed)
[facility] EMERGENCY DEPARTMENT Provider Note   CSN: [id number] Arrival date & time: 05/14/21  1536     History  Chief Complaint  Patient presents with   Dental Pain    Susan Beasley is a 26 y.o. female with no significant past medical history who presents to the ED complaining of left lower dental pain onset 5 days. Patient has a crown placed to left lower molar.  The crown came off 5 days ago.  Patient cleaned the crown in her teeth and put the crown back on her tooth.  She has had pain for the past 2 days.  She also notes area on her gum that is tender underneath the crown and she has noted drainage from the area.  She does have a dentist, however she is unable to get in with them until the end of the month.  She has associated nausea.  Patient has not tried any medications for her symptoms.  Denies fever, chills, chest pain, shortness of breath, abdominal pain, vomiting.    The history is provided by the patient and a parent. No language interpreter was used.      Home Medications Prior to Admission medications   Medication Sig Start Date End Date Taking? Authorizing Provider  penicillin v potassium (VEETID) 500 MG tablet Take 1 tablet (500 mg total) by mouth 4 (four) times daily for 7 days. 05/14/21 05/21/21 Yes Benecio Kluger A, PA-C  amoxicillin-clavulanate (AUGMENTIN) 875-125 MG tablet Take 1 tablet by mouth every 12 (twelve) hours. 07/10/17   Standley Brooking, MD  ibuprofen (ADVIL) 800 MG tablet Take 1 tablet (800 mg total) by mouth 3 (three) times daily. 05/12/20   Mare Ferrari, PA-C  naproxen (NAPROSYN) 500 MG tablet Take 1 tablet (500 mg total) by mouth 2 (two) times daily. 10/17/17   Vanetta Mulders, MD  oxyCODONE (OXY IR/ROXICODONE) 5 MG immediate release tablet Take 0.5 tablets (2.5 mg total) by mouth every 8 (eight) hours as needed for moderate pain. 07/10/17   Standley Brooking, MD      Allergies    Patient has no known allergies.    Review of Systems   Review  of Systems  Constitutional:  Negative for chills and fever.  HENT:  Positive for dental problem.   Respiratory:  Negative for shortness of breath.   Cardiovascular:  Negative for chest pain.  Gastrointestinal:  Positive for nausea. Negative for abdominal pain and vomiting.  All other systems reviewed and are negative.  Physical Exam Updated Vital Signs BP 125/80 (BP Location: Right Arm)    Pulse 87    Temp 99.5 F (37.5 C) (Oral)    Resp 18    Ht 5\' 2"  (1.575 m)    Wt 72.6 kg    LMP 04/19/2021    SpO2 100%    BMI 29.26 kg/m  Physical Exam Vitals and nursing note reviewed.  Constitutional:      General: She is not in acute distress.    Appearance: She is not ill-appearing.  HENT:     Head: Normocephalic and atraumatic.     Right Ear: Tympanic membrane, ear canal and external ear normal.     Left Ear: Tympanic membrane, ear canal and external ear normal.     Nose: Nose normal.     Mouth/Throat:     Mouth: Mucous membranes are moist.     Dentition: Abnormal dentition. Dental tenderness and dental caries present.     Tongue: Tongue does not  deviate from midline.     Pharynx: Oropharynx is clear. Uvula midline. No oropharyngeal exudate or posterior oropharyngeal erythema.     Tonsils: No tonsillar exudate or tonsillar abscesses.     Comments: Crown noted to left lower molar.  Area of induration noted to left lower gumline.  Mild tenderness to palpation to left lower gum line. No fluctuance noted. No trismus. No retropharyngeal abscess. No peritonsillar abscess noted. Uvula midline. Eyes:     Extraocular Movements: Extraocular movements intact.  Cardiovascular:     Rate and Rhythm: Normal rate and regular rhythm.     Pulses: Normal pulses.     Heart sounds: Normal heart sounds.  Pulmonary:     Effort: Pulmonary effort is normal. No respiratory distress.     Breath sounds: Normal breath sounds.  Abdominal:     General: Bowel sounds are normal. There is no distension.     Palpations:  Abdomen is soft. There is no mass.     Tenderness: There is no abdominal tenderness.  Musculoskeletal:        General: Normal range of motion.     Cervical back: Neck supple.  Lymphadenopathy:     Head:     Right side of head: No submental, submandibular, tonsillar, preauricular or posterior auricular adenopathy.     Left side of head: No submental, submandibular, tonsillar, preauricular or posterior auricular adenopathy.     Cervical: No cervical adenopathy.  Skin:    General: Skin is warm and dry.     Findings: No rash.  Neurological:     Mental Status: She is alert.  Psychiatric:        Behavior: Behavior normal.    ED Results / Procedures / Treatments   Labs (all labs ordered are listed, but only abnormal results are displayed) Labs Reviewed - No data to display  EKG None  Radiology No results found.  Procedures Procedures    Medications Ordered in ED Medications  penicillin v potassium (VEETID) tablet 500 mg (500 mg Oral Given 05/14/21 1804)    ED Course/ Medical Decision Making/ A&P                           Medical Decision Making  Patient presents with left lower dental pain x2 days.  Patient with a crown to left lower molar that came off 5 days ago.  Patient put the crown back on her tooth before noting pain 2 days ago.  She does have a dentist at this time.  However, she is not able to get in with her dentist until the end of the month.  On exam patient with crown noted to left lower molar, area of induration noted to left lower gumline.  Mild tenderness to palpation to left lower gumline.  No fluctuance noted.  No trismus.  No retropharyngeal abscess.  No peritonsillar abscess noted.  Uvula midline.  Vital signs stable, patient afebrile, not hypoxic.  Differential diagnosis includes dental abscess, PTA, retropharyngeal abscess, Ludwigs angina.  Disposition: Patient presentation suspicious for dental abscess.  No obvious area of fluctuance for drainage in the ED  today.  Doubt Ludwigs, PTA, retropharyngeal abscess.  No cervical lymphadenopathy, no trismus.  Patent airway.  No change in voice.  No trouble swallowing.  After consideration of the diagnostic results and the patients response to treatment, I feel that the patent would benefit from discharge home with penicillin.  Discussed with patient to contact her dentist  to schedule a follow-up appointment regarding today's ED visit.  Supportive care measures and strict return precautions discussed with patient at bedside. Pt acknowledges and verbalizes understanding. Pt appears safe for discharge. Follow up as indicated in discharge paperwork.      This chart was dictated using voice recognition software, Dragon. Despite the best efforts of this provider to proofread and correct errors, errors may still occur which can change documentation meaning.   Final Clinical Impression(s) / ED Diagnoses Final diagnoses:  Pain, dental    Rx / DC Orders ED Discharge Orders          Ordered    penicillin v potassium (VEETID) 500 MG tablet  4 times daily        05/14/21 1742              Amberlee Garvey A, PA-C 05/14/21 2013    Virgina Norfolk, DO 05/14/21 2135

## 2021-05-14 NOTE — ED Triage Notes (Signed)
Pt c/o LT lower dental pain with swelling to jaw; crowned tooth broke a couple months ago; pain since Fri

## 2022-11-01 ENCOUNTER — Encounter (HOSPITAL_BASED_OUTPATIENT_CLINIC_OR_DEPARTMENT_OTHER): Payer: Self-pay

## 2022-11-01 ENCOUNTER — Emergency Department (HOSPITAL_BASED_OUTPATIENT_CLINIC_OR_DEPARTMENT_OTHER)
Admission: EM | Admit: 2022-11-01 | Discharge: 2022-11-01 | Disposition: A | Discharge status: Home / Self Care | Payer: BLUE CROSS/BLUE SHIELD | Attending: Emergency Medicine | Admitting: Emergency Medicine

## 2022-11-01 ENCOUNTER — Emergency Department (HOSPITAL_BASED_OUTPATIENT_CLINIC_OR_DEPARTMENT_OTHER): Payer: BLUE CROSS/BLUE SHIELD

## 2022-11-01 ENCOUNTER — Other Ambulatory Visit: Payer: Self-pay

## 2022-11-01 DIAGNOSIS — N83201 Unspecified ovarian cyst, right side: Secondary | ICD-10-CM | POA: Diagnosis not present

## 2022-11-01 DIAGNOSIS — R1031 Right lower quadrant pain: Secondary | ICD-10-CM | POA: Diagnosis present

## 2022-11-01 LAB — CBC WITH DIFFERENTIAL/PLATELET
Abs Immature Granulocytes: 0.07 10*3/uL (ref 0.00–0.07)
Basophils Absolute: 0.1 10*3/uL (ref 0.0–0.1)
Basophils Relative: 0 %
Eosinophils Absolute: 0.1 10*3/uL (ref 0.0–0.5)
Eosinophils Relative: 0 %
HCT: 32.6 % — ABNORMAL LOW (ref 36.0–46.0)
Hemoglobin: 9.9 g/dL — ABNORMAL LOW (ref 12.0–15.0)
Immature Granulocytes: 1 %
Lymphocytes Relative: 16 %
Lymphs Abs: 2.1 10*3/uL (ref 0.7–4.0)
MCH: 23.1 pg — ABNORMAL LOW (ref 26.0–34.0)
MCHC: 30.4 g/dL (ref 30.0–36.0)
MCV: 76 fL — ABNORMAL LOW (ref 80.0–100.0)
Monocytes Absolute: 1.1 10*3/uL — ABNORMAL HIGH (ref 0.1–1.0)
Monocytes Relative: 8 %
Neutro Abs: 10.4 10*3/uL — ABNORMAL HIGH (ref 1.7–7.7)
Neutrophils Relative %: 75 %
Platelets: 407 10*3/uL — ABNORMAL HIGH (ref 150–400)
RBC: 4.29 MIL/uL (ref 3.87–5.11)
RDW: 17 % — ABNORMAL HIGH (ref 11.5–15.5)
WBC: 13.7 10*3/uL — ABNORMAL HIGH (ref 4.0–10.5)
nRBC: 0 % (ref 0.0–0.2)

## 2022-11-01 LAB — URINALYSIS, W/ REFLEX TO CULTURE (INFECTION SUSPECTED)
Bilirubin Urine: NEGATIVE
Glucose, UA: NEGATIVE mg/dL
Ketones, ur: NEGATIVE mg/dL
Leukocytes,Ua: NEGATIVE
Nitrite: NEGATIVE
Protein, ur: NEGATIVE mg/dL
Specific Gravity, Urine: 1.025 (ref 1.005–1.030)
pH: 7 (ref 5.0–8.0)

## 2022-11-01 LAB — COMPREHENSIVE METABOLIC PANEL
ALT: 26 U/L (ref 0–44)
AST: 26 U/L (ref 15–41)
Albumin: 3.5 g/dL (ref 3.5–5.0)
Alkaline Phosphatase: 82 U/L (ref 38–126)
Anion gap: 8 (ref 5–15)
BUN: 7 mg/dL (ref 6–20)
CO2: 21 mmol/L — ABNORMAL LOW (ref 22–32)
Calcium: 8.6 mg/dL — ABNORMAL LOW (ref 8.9–10.3)
Chloride: 104 mmol/L (ref 98–111)
Creatinine, Ser: 0.68 mg/dL (ref 0.44–1.00)
GFR, Estimated: >60 mL/min (ref >60)
Glucose, Bld: 103 mg/dL — ABNORMAL HIGH (ref 70–99)
Potassium: 3.7 mmol/L (ref 3.5–5.1)
Sodium: 133 mmol/L — ABNORMAL LOW (ref 135–145)
Total Bilirubin: 0.2 mg/dL — ABNORMAL LOW (ref 0.3–1.2)
Total Protein: 7.7 g/dL (ref 6.5–8.1)

## 2022-11-01 LAB — PREGNANCY, URINE: Preg Test, Ur: NEGATIVE

## 2022-11-01 LAB — LIPASE, BLOOD: Lipase: 28 U/L (ref 11–51)

## 2022-11-01 MED ORDER — SODIUM CHLORIDE 0.9 % IV BOLUS
1000.0000 mL | Freq: Once | INTRAVENOUS | Status: AC
  Administered 2022-11-01: 1000 mL via INTRAVENOUS

## 2022-11-01 MED ORDER — ONDANSETRON HCL 4 MG/2ML IJ SOLN
4.0000 mg | Freq: Once | INTRAMUSCULAR | Status: AC
  Administered 2022-11-01: 4 mg via INTRAVENOUS
  Filled 2022-11-01: qty 2

## 2022-11-01 MED ORDER — ONDANSETRON HCL 4 MG PO TABS: 4.0000 mg | ORAL_TABLET | Freq: Four times a day (QID) | ORAL | 0 refills | Status: AC

## 2022-11-01 MED ORDER — MORPHINE SULFATE (PF) 4 MG/ML IV SOLN
4.0000 mg | Freq: Once | INTRAVENOUS | Status: AC
  Administered 2022-11-01: 4 mg via INTRAVENOUS
  Filled 2022-11-01: qty 1

## 2022-11-01 MED ORDER — KETOROLAC TROMETHAMINE 15 MG/ML IJ SOLN
15.0000 mg | Freq: Once | INTRAMUSCULAR | Status: AC
  Administered 2022-11-01: 15 mg via INTRAVENOUS
  Filled 2022-11-01: qty 1

## 2022-11-01 MED ORDER — IOHEXOL 300 MG/ML  SOLN
100.0000 mL | Freq: Once | INTRAMUSCULAR | Status: AC | PRN
  Administered 2022-11-01: 100 mL via INTRAVENOUS

## 2022-11-01 NOTE — ED Notes (Signed)
Urine specimin in lab 

## 2022-11-01 NOTE — ED Provider Notes (Signed)
[place] EMERGENCY DEPARTMENT AT [facility] Provider Note   CSN: [id number] Arrival date & time: 11/01/22  1446     History  Chief Complaint  Patient presents with   Abdominal Pain    Susan Beasley is a 27 y.o. female.  Susan Beasley is a 27 y.o. female with a history of PTSD, ovarian cyst, who presents to the emergency department for evaluation of abdominal pain.  Patient reports that she has had right lower abdominal pain radiating into her back for about a week.  She describes the pain as moderate and constant.  She has had some associated nausea and vomiting as well as some diarrhea.  Denies any blood in her stool or emesis.  She reports that she has had intermittent fevers up to 102 that have been relieved with Tylenol she also reports some generalized bodyaches and neck pain.  No cough, chest pain or shortness of breath.  No known sick contacts.  She reports some mild urinary frequency but no dysuria.  No vaginal bleeding or vaginal discharge.  Patient reports she is sexually active with 1 partner and denies concern for STI.  Reports 2 prior pregnancies, no abdominal surgeries.  The history is provided by the patient and a relative.  Abdominal Pain Associated symptoms: diarrhea, fever, nausea and vomiting   Associated symptoms: no chest pain, no chills, no cough, no dysuria, no shortness of breath, no vaginal bleeding and no vaginal discharge        Home Medications Prior to Admission medications   Medication Sig Start Date End Date Taking? Authorizing Provider  FLUoxetine (PROZAC) 20 MG capsule Take 60 mg by mouth daily. 06/12/22  Yes [provider]  hydrOXYzine (ATARAX) 25 MG tablet Take 25 mg by mouth every 6 (six) hours as needed. 06/12/22  Yes [provider]  ondansetron (ZOFRAN) 4 MG tablet Take 1 tablet (4 mg total) by mouth every 6 (six) hours. 11/01/22  Yes Dartha Lodge, PA-C  pantoprazole (PROTONIX) 20 MG tablet Take 20 mg by mouth daily.  06/12/22  Yes [provider]  amoxicillin-clavulanate (AUGMENTIN) 875-125 MG tablet Take 1 tablet by mouth every 12 (twelve) hours. 07/10/17   Standley Brooking, MD  Collagen-Vitamin C-Biotin (COLLAGEN 1500/C) 500-50-0.8 MG CAPS Take 1 capsule by mouth daily.    [provider]  ibuprofen (ADVIL) 800 MG tablet Take 1 tablet (800 mg total) by mouth 3 (three) times daily. 05/12/20   Mare Ferrari, PA-C  naproxen (NAPROSYN) 500 MG tablet Take 1 tablet (500 mg total) by mouth 2 (two) times daily. 10/17/17   Vanetta Mulders, MD  oxyCODONE (OXY IR/ROXICODONE) 5 MG immediate release tablet Take 0.5 tablets (2.5 mg total) by mouth every 8 (eight) hours as needed for moderate pain. 07/10/17   Standley Brooking, MD      Allergies    Patient has no known allergies.    Review of Systems   Review of Systems  Constitutional:  Positive for fever. Negative for chills.  HENT: Negative.    Respiratory:  Negative for cough and shortness of breath.   Cardiovascular:  Negative for chest pain.  Gastrointestinal:  Positive for abdominal pain, diarrhea, nausea and vomiting. Negative for blood in stool.  Genitourinary:  Positive for frequency. Negative for dysuria, vaginal bleeding and vaginal discharge.    Physical Exam Updated Vital Signs BP 115/61 (BP Location: Left Arm)   Pulse 88   Temp 98.2 F (36.8 C) (Oral)   Resp 17  Ht 5\' 2"  (1.575 m)   Wt 67.1 kg   LMP 10/11/2022   SpO2 100%   BMI 27.07 kg/m  Physical Exam Vitals and nursing note reviewed.  Constitutional:      General: She is not in acute distress.    Appearance: Normal appearance. She is well-developed. She is not diaphoretic.  HENT:     Head: Normocephalic and atraumatic.  Eyes:     General:        Right eye: No discharge.        Left eye: No discharge.     Pupils: Pupils are equal, round, and reactive to light.  Cardiovascular:     Rate and Rhythm: Normal rate and regular rhythm.     Pulses: Normal pulses.      Heart sounds: Normal heart sounds.  Pulmonary:     Effort: Pulmonary effort is normal. No respiratory distress.     Breath sounds: Normal breath sounds. No wheezing or rales.     Comments: Respirations equal and unlabored, patient able to speak in full sentences, lungs clear to auscultation bilaterally  Abdominal:     General: Bowel sounds are normal. There is no distension.     Palpations: Abdomen is soft. There is no mass.     Tenderness: There is abdominal tenderness in the right lower quadrant, suprapubic area and left lower quadrant. There is no right CVA tenderness, left CVA tenderness or guarding.     Comments: Abdomen soft, nondistended, Mild tenderness present on the right side of the abdomen, neg McBurney's and neg murphy's signs, no guarding or rebound tenderness  Musculoskeletal:        General: No deformity.     Cervical back: Neck supple.  Skin:    General: Skin is warm and dry.     Capillary Refill: Capillary refill takes less than 2 seconds.  Neurological:     Mental Status: She is alert and oriented to person, place, and time.     Coordination: Coordination normal.     Comments: Speech is clear, able to follow commands CN III-XII intact Normal strength in upper and lower extremities bilaterally including dorsiflexion and plantar flexion, strong and equal grip strength Sensation normal to light and sharp touch Moves extremities without ataxia, coordination intact  Psychiatric:        Mood and Affect: Mood normal.        Behavior: Behavior normal.     ED Results / Procedures / Treatments   Labs (all labs ordered are listed, but only abnormal results are displayed) Labs Reviewed  COMPREHENSIVE METABOLIC PANEL - Abnormal; Notable for the following components:      Result Value   Sodium 133 (*)    CO2 21 (*)    Glucose, Bld 103 (*)    Calcium 8.6 (*)    Total Bilirubin 0.2 (*)    All other components within normal limits  CBC WITH DIFFERENTIAL/PLATELET -  Abnormal; Notable for the following components:   WBC 13.7 (*)    Hemoglobin 9.9 (*)    HCT 32.6 (*)    MCV 76.0 (*)    MCH 23.1 (*)    RDW 17.0 (*)    Platelets 407 (*)    Neutro Abs 10.4 (*)    Monocytes Absolute 1.1 (*)    All other components within normal limits  URINALYSIS, W/ REFLEX TO CULTURE (INFECTION SUSPECTED) - Abnormal; Notable for the following components:   Hgb urine dipstick TRACE (*)  Bacteria, UA RARE (*)    All other components within normal limits  LIPASE, BLOOD  PREGNANCY, URINE    EKG None  Radiology CT ABDOMEN PELVIS W CONTRAST  Result Date: 11/01/2022 CLINICAL DATA:  Right lower quadrant abdominal pain EXAM: CT ABDOMEN AND PELVIS WITH CONTRAST TECHNIQUE: Multidetector CT imaging of the abdomen and pelvis was performed using the standard protocol following bolus administration of intravenous contrast. RADIATION DOSE REDUCTION: This exam was performed according to the departmental dose-optimization program which includes automated exposure control, adjustment of the mA and/or kV according to patient size and/or use of iterative reconstruction technique. CONTRAST:  OMNIPAQUE IOHEXOL 300 MG/ML  SOLN COMPARISON:  None Available. FINDINGS: Lower chest: No acute pleural or parenchymal lung disease. Hepatobiliary: No focal liver abnormality is seen. No gallstones, gallbladder wall thickening, or biliary dilatation. Pancreas: Unremarkable. No pancreatic ductal dilatation or surrounding inflammatory changes. Spleen: Normal in size without focal abnormality. Adrenals/Urinary Tract: Adrenal glands are unremarkable. Kidneys are normal, without renal calculi, focal lesion, or hydronephrosis. Bladder is unremarkable. Stomach/Bowel: No bowel obstruction or ileus. Normal appendix right lower quadrant. No bowel wall thickening or inflammatory change. Vascular/Lymphatic: No significant vascular findings are present. No enlarged abdominal or pelvic lymph nodes. Reproductive:  Simple 4.2 x 3.7 x 3.2 cm right ovarian cyst. Left ovary is unremarkable. Uterus is grossly normal. Other: Small amount of free fluid within the lower pelvis is likely physiologic. No free intraperitoneal gas. No abdominal wall hernia. Musculoskeletal: No acute or destructive bony abnormalities. Reconstructed images demonstrate no additional findings. IMPRESSION: 1. Right ovarian simple-appearing cyst measuring 4.2 cm. No follow-up imaging is recommended. Reference: JACR 2020 Feb;17(2):248-254 2. Small amount of pelvic free fluid, likely physiologic. 3. Otherwise no acute intra-abdominal or intrapelvic process. Normal appendix. Electronically Signed   By: Sharlet Salina M.D.   On: 11/01/2022 17:32    Procedures Procedures    Medications Ordered in ED Medications  sodium chloride 0.9 % bolus 1,000 mL (0 mLs Intravenous Stopped 11/01/22 1813)  morphine (PF) 4 MG/ML injection 4 mg (4 mg Intravenous Given 11/01/22 1626)  ondansetron (ZOFRAN) injection 4 mg (4 mg Intravenous Given 11/01/22 1624)  iohexol (OMNIPAQUE) 300 MG/ML solution 100 mL (100 mLs Intravenous Contrast Given 11/01/22 1704)  ketorolac (TORADOL) 15 MG/ML injection 15 mg (15 mg Intravenous Given 11/01/22 1817)    ED Course/ Medical Decision Making/ A&P                             Medical Decision Making Amount and/or Complexity of Data Reviewed Labs: ordered. Radiology: ordered.  Risk Prescription drug management.   Patient presents to the ED with complaints of abdominal pain. Patient nontoxic appearing, in no apparent distress, vitals WNL. On exam patient tender to along the right side of the abdomen, no peritoneal signs. Will evaluate with labs and ct and pelvis.   Ddx including but not limited to: Cystitis, cholecystitis, pyelonephritis, nephrolithiasis, ovarian cyst, ovarian torsion, ectopic pregnancy, PID, colitis, gastroenteritis, viral infection, pancreatitis, gastritis, obstruction, perforation  Additional history  obtained:  Additional history obtained from chart review & nursing note review.   Lab Tests:  I Ordered, viewed, and interpreted labs, which included:  CBC: Mild leukocytosis of 13.7, Hgb of 9.9 most recent comparison is from 5 years ago at 11.7, patient does not have any bleeding symptoms CMP:.  Mild hyponatremia suspect this is in the setting of mild dehydration, normal renal and liver function Lipase: WNL  UA: No hematuria or signs for infection Preg test: Negative  Imaging Studies ordered:  I ordered imaging studies which included CT abdomen pelvis, I independently reviewed, formal radiology impression shows:  Right ovarian simple appearing cyst measuring 4.2 cm, no other findings noted on CT, appendix and gallbladder both appear normal  ED Course:  I ordered IV fluids, morphine, Zofran and Toradol   RE-EVAL: Patient is feeling much better, I discussed reassuring labs and imaging, given reported subjective fevers could be a viral infection, ovarian cyst prescription has been contributing to pain but I have extremely low suspicion for torsion as patient appears quite comfortable and pain has been mild to moderate.  Also discussed that patient could have had a small or recently passed kidney stone contributing to symptoms.  On repeat abdominal exam patient remains without peritoneal signs, low suspicion for cholecystitis, pancreatitis, diverticulitis, appendicitis, bowel obstruction/perforation,  PID, ectopic pregnancy, or other acute surgical process. Patient tolerating PO in the emergency department. Will discharge home with supportive measures. I discussed results, treatment plan, need for PCP follow-up, and return precautions with the patient. Provided opportunity for questions, patient confirmed understanding and is in agreement with plan.   Portions of this note were generated with Scientist, clinical (histocompatibility and immunogenetics). Dictation errors may occur despite best attempts at  proofreading.           Final Clinical Impression(s) / ED Diagnoses Final diagnoses:  RLQ abdominal pain  Right ovarian cyst    Rx / DC Orders ED Discharge Orders          Ordered    ondansetron (ZOFRAN) 4 MG tablet  Every 6 hours        11/01/22 1807              Dartha Lodge, PA-C 11/22/22 0624    Franne Forts, DO 11/26/22 2330

## 2022-11-01 NOTE — ED Triage Notes (Signed)
Pt c/o right sided abd x 1 week. Pt c/o both diarrhea and constipation. Pt c/o nausea and vomiting as well.  Pt c/o right sided back pain and neck pain Tmax 102 during the evening, relieved with tylenol

## 2022-11-01 NOTE — Discharge Instructions (Signed)
Your lab work and CT scan today were overall reassuring.  Aside from a mildly elevated white blood cell count your labs look good, we did not see signs of urinary tract infection.  Your CT scan shows a right 4 x 3 cm ovarian cyst this could be contributing to your pain, another possibility would be a small kidney stone attempting to pass or that has recently passed given the small amount of blood in your urine.  As far as the reported fevers that could be due to a viral infection, continue to use Tylenol and ibuprofen to help with abdominal pain as well as fevers and chills, use prescribed Zofran as needed for nausea and vomiting.  Return for any new or worsening symptoms.  If you have sudden onset severe right lower quadrant abdominal pain this could be a sign of ovarian torsion which can occur when you have an ovarian cyst, if this happens you should return to the emergency department immediately.  I gave you a handout with more information on this condition on your paperwork today.  You can follow-up with OB/GYN for further evaluation of your ovarian cyst.

## 2022-12-13 ENCOUNTER — Emergency Department (HOSPITAL_BASED_OUTPATIENT_CLINIC_OR_DEPARTMENT_OTHER)
Admission: EM | Admit: 2022-12-13 | Discharge: 2022-12-13 | Disposition: A | Discharge status: Home / Self Care | Payer: BLUE CROSS/BLUE SHIELD | Attending: Emergency Medicine | Admitting: Emergency Medicine

## 2022-12-13 ENCOUNTER — Encounter (HOSPITAL_BASED_OUTPATIENT_CLINIC_OR_DEPARTMENT_OTHER): Payer: Self-pay

## 2022-12-13 DIAGNOSIS — U071 COVID-19: Secondary | ICD-10-CM | POA: Diagnosis not present

## 2022-12-13 DIAGNOSIS — J029 Acute pharyngitis, unspecified: Secondary | ICD-10-CM | POA: Diagnosis present

## 2022-12-13 LAB — GROUP A STREP BY PCR: Group A Strep by PCR: NOT DETECTED

## 2022-12-13 LAB — SARS CORONAVIRUS 2 BY RT PCR: SARS Coronavirus 2 by RT PCR: POSITIVE — AB

## 2022-12-13 MED ORDER — LIDOCAINE VISCOUS HCL 2 % MT SOLN
15.0000 mL | Freq: Once | OROMUCOSAL | Status: AC
  Administered 2022-12-13: 15 mL via OROMUCOSAL
  Filled 2022-12-13: qty 15

## 2022-12-13 MED ORDER — LIDOCAINE VISCOUS HCL 2 % MT SOLN: 15.0000 mL | OROMUCOSAL | 0 refills | Status: AC | PRN

## 2022-12-13 MED ORDER — DEXAMETHASONE SODIUM PHOSPHATE 10 MG/ML IJ SOLN
10.0000 mg | Freq: Once | INTRAMUSCULAR | Status: AC
  Administered 2022-12-13: 10 mg via INTRAMUSCULAR
  Filled 2022-12-13: qty 1

## 2022-12-13 NOTE — ED Triage Notes (Signed)
Pt complains of sore throat x 9 days and fever x 3 days

## 2022-12-13 NOTE — ED Provider Notes (Signed)
[place] [facility] Provider Note   CSN: [id number] Arrival date & time: 12/13/22  1702     History Chief Complaint  Patient presents with   Sore Throat    Susan Beasley is a 27 y.o. female patient who presents to the emergency department with a 1 week history of sore throat and 3-day history of fever at night.  Patient states she initially started with a mild sore throat but this is progressively worsened.  She is able to swallow although it is painful.  She denies any shortness of breath, trouble talking, abdominal pain, nausea, vomiting, diarrhea.  She does endorse mild cough.   Sore Throat       Home Medications Prior to Admission medications   Medication Sig Start Date End Date Taking? Authorizing Provider  lidocaine (XYLOCAINE) 2 % solution Use as directed 15 mLs in the mouth or throat as needed for mouth pain. 12/13/22  Yes Meredeth Ide, Laiyla Slagel M, PA-C  amoxicillin-clavulanate (AUGMENTIN) 875-125 MG tablet Take 1 tablet by mouth every 12 (twelve) hours. 07/10/17   Standley Brooking, MD  Collagen-Vitamin C-Biotin (COLLAGEN 1500/C) 500-50-0.8 MG CAPS Take 1 capsule by mouth daily.    [provider]  FLUoxetine (PROZAC) 20 MG capsule Take 60 mg by mouth daily. 06/12/22   [provider]  hydrOXYzine (ATARAX) 25 MG tablet Take 25 mg by mouth every 6 (six) hours as needed. 06/12/22   [provider]  ibuprofen (ADVIL) 800 MG tablet Take 1 tablet (800 mg total) by mouth 3 (three) times daily. 05/12/20   Mare Ferrari, PA-C  naproxen (NAPROSYN) 500 MG tablet Take 1 tablet (500 mg total) by mouth 2 (two) times daily. 10/17/17   Vanetta Mulders, MD  ondansetron (ZOFRAN) 4 MG tablet Take 1 tablet (4 mg total) by mouth every 6 (six) hours. 11/01/22   Dartha Lodge, PA-C  oxyCODONE (OXY IR/ROXICODONE) 5 MG immediate release tablet Take 0.5 tablets (2.5 mg total) by mouth every 8 (eight) hours as needed for moderate pain. 07/10/17    Standley Brooking, MD  pantoprazole (PROTONIX) 20 MG tablet Take 20 mg by mouth daily. 06/12/22   [provider]      Allergies    Patient has no known allergies.    Review of Systems   Review of Systems  All other systems reviewed and are negative.   Physical Exam Updated Vital Signs BP 123/69   Pulse 71   Temp 99.2 F (37.3 C) (Oral)   Resp 16   Wt 65.3 kg   LMP 11/17/2022   SpO2 100%   BMI 26.34 kg/m  Physical Exam Vitals and nursing note reviewed.  Constitutional:      Appearance: Normal appearance.  HENT:     Head: Normocephalic and atraumatic.     Mouth/Throat:     Comments: There is tonsillar hypertrophy bilaterally but the left is worse than the right.  There is slight uvular deviation to the left.  Voice is normal.  Tongue is normal.  No tonsillar exudate. Eyes:     General:        Right eye: No discharge.        Left eye: No discharge.     Conjunctiva/sclera: Conjunctivae normal.  Pulmonary:     Effort: Pulmonary effort is normal.     Breath sounds: Normal breath sounds.  Skin:    General: Skin is warm and dry.     Findings: No rash.  Neurological:     General: No focal deficit present.     Mental Status: She is alert.  Psychiatric:        Mood and Affect: Mood normal.        Behavior: Behavior normal.     ED Results / Procedures / Treatments   Labs (all labs ordered are listed, but only abnormal results are displayed) Labs Reviewed  SARS CORONAVIRUS 2 BY RT PCR - Abnormal; Notable for the following components:      Result Value   SARS Coronavirus 2 by RT PCR POSITIVE (*)    All other components within normal limits  GROUP A STREP BY PCR    EKG None  Radiology No results found.  Procedures Procedures    Medications Ordered in ED Medications  lidocaine (XYLOCAINE) 2 % viscous mouth solution 15 mL (15 mLs Mouth/Throat Given 12/13/22 1742)  dexamethasone (DECADRON) injection 10 mg (10 mg Intramuscular Given 12/13/22 1742)     ED Course/ Medical Decision Making/ A&P Clinical Course as of 12/13/22 2328  Thu Dec 13, 2022  1748 Group A Strep by PCR Normal.  [CF]  1748 SARS Coronavirus 2 by RT PCR (hospital order, performed in [facility] hospital lab) *cepheid single result test* Throat(!) Positive.  [CF]  1756 Notified patient of all lab results.  Will treat conservatively with ibuprofen and Tylenol.  I will prescribe her Xylocaine for her sore throat.  Decadron given.  She will quarantine return precautions were given.  She is stable discharge. [CF]    Clinical Course User Index [CF] Teressa Lower, PA-C   {   Click here for ABCD2, HEART and other calculators  Medical Decision Making Monya Deadrick is a 27 y.o. female patient who presents to the emergency department today for further evaluation of sore throat.  Patient is nontoxic-appearing and resting comfortably in the emergency department.  She does have a slight low-grade fever but other vital signs are completely normal.  She does not appear to be in acute distress and is not having any trouble breathing or talking.  I will plan to get a strep test and COVID test.  In the meantime, I will give her viscous Xylocaine for temporary pain relief in addition to some Decadron to help with the tonsillar hypertrophy and swelling.   Amount and/or Complexity of Data Reviewed Labs:  Decision-making details documented in ED Course.  Risk Prescription drug management.   Final Clinical Impression(s) / ED Diagnoses Final diagnoses:  COVID    Rx / DC Orders ED Discharge Orders          Ordered    lidocaine (XYLOCAINE) 2 % solution  As needed        12/13/22 1759              Teressa Lower, PA-C 12/13/22 2329    Maia Plan, MD 12/14/22 1553

## 2022-12-13 NOTE — Discharge Instructions (Signed)
Please take 600 mg of ibuprofen every 6 hours as needed for pain and fever.  You can stack on Tylenol at the 3 to 4-hour mark like we discussed.  I am going to prescribe you Xylocaine which is going to help with your sore throat.  I would stay quarantined until you are 24 hours free of fever.

## 2023-01-16 ENCOUNTER — Encounter (HOSPITAL_BASED_OUTPATIENT_CLINIC_OR_DEPARTMENT_OTHER): Payer: Self-pay | Admitting: Emergency Medicine

## 2023-01-16 ENCOUNTER — Emergency Department (HOSPITAL_BASED_OUTPATIENT_CLINIC_OR_DEPARTMENT_OTHER)
Admission: EM | Admit: 2023-01-16 | Discharge: 2023-01-16 | Disposition: A | Discharge status: Home / Self Care | Payer: BLUE CROSS/BLUE SHIELD | Attending: Emergency Medicine | Admitting: Emergency Medicine

## 2023-01-16 ENCOUNTER — Other Ambulatory Visit: Payer: Self-pay

## 2023-01-16 DIAGNOSIS — R5383 Other fatigue: Secondary | ICD-10-CM | POA: Insufficient documentation

## 2023-01-16 DIAGNOSIS — J029 Acute pharyngitis, unspecified: Secondary | ICD-10-CM | POA: Diagnosis not present

## 2023-01-16 DIAGNOSIS — J039 Acute tonsillitis, unspecified: Secondary | ICD-10-CM

## 2023-01-16 DIAGNOSIS — Z20822 Contact with and (suspected) exposure to covid-19: Secondary | ICD-10-CM | POA: Insufficient documentation

## 2023-01-16 LAB — GROUP A STREP BY PCR: Group A Strep by PCR: NOT DETECTED

## 2023-01-16 LAB — MONONUCLEOSIS SCREEN: Mono Screen: NEGATIVE

## 2023-01-16 LAB — SARS CORONAVIRUS 2 BY RT PCR: SARS Coronavirus 2 by RT PCR: NEGATIVE

## 2023-01-16 MED ORDER — AMOXICILLIN-POT CLAVULANATE 875-125 MG PO TABS: 1.0000 | ORAL_TABLET | Freq: Two times a day (BID) | ORAL | 0 refills | Status: AC

## 2023-01-16 MED ORDER — DEXAMETHASONE SODIUM PHOSPHATE 10 MG/ML IJ SOLN
10.0000 mg | Freq: Once | INTRAMUSCULAR | Status: AC
  Administered 2023-01-16: 10 mg via INTRAMUSCULAR
  Filled 2023-01-16: qty 1

## 2023-01-16 NOTE — Discharge Instructions (Signed)
You were seen in the emergency room today for sore throat.  I sent an antibiotic to your pharmacy please take as prescribed.  We tested you for mono, your results are still pending.  Please call if you do not hear back on your results by tomorrow.  Call to schedule an appointment with your primary care doctor, you can follow-up with them to ensure resolution of symptoms.  Please return to the emergency room with new or worsening symptoms.  Make sure to stay hydrated with water, supplement Pedialyte and Gatorade, as needed.

## 2023-01-16 NOTE — ED Provider Notes (Signed)
[facility] Provider Note   CSN: [id number] Arrival date & time: 01/16/23  1206     History  Chief Complaint  Patient presents with   Sore Throat    Susan Beasley is a 27 y.o. female senting to the emergency room with 1 week of sore throat, lymphadenopathy, exudates on tonsil. Pt has also had chills and fatigue worsening over the past few days. Reports she is having difficulty swallowing oral secretions however able to oral intake. No known sick exposure, believes UTD on vaccinations. Denies SOB, CP, NVD. No neck rigidity, no difficulty breathing, no stridor, no muffled voice or change in voice.   Sore Throat       Home Medications Prior to Admission medications   Medication Sig Start Date End Date Taking? Authorizing Provider  amoxicillin-clavulanate (AUGMENTIN) 875-125 MG tablet Take 1 tablet by mouth every 12 (twelve) hours. 07/10/17   Standley Brooking, MD  Collagen-Vitamin C-Biotin (COLLAGEN 1500/C) 500-50-0.8 MG CAPS Take 1 capsule by mouth daily.    [provider]  FLUoxetine (PROZAC) 20 MG capsule Take 60 mg by mouth daily. 06/12/22   [provider]  hydrOXYzine (ATARAX) 25 MG tablet Take 25 mg by mouth every 6 (six) hours as needed. 06/12/22   [provider]  ibuprofen (ADVIL) 800 MG tablet Take 1 tablet (800 mg total) by mouth 3 (three) times daily. 05/12/20   Mare Ferrari, PA-C  lidocaine (XYLOCAINE) 2 % solution Use as directed 15 mLs in the mouth or throat as needed for mouth pain. 12/13/22   Honor Loh M, PA-C  naproxen (NAPROSYN) 500 MG tablet Take 1 tablet (500 mg total) by mouth 2 (two) times daily. 10/17/17   Vanetta Mulders, MD  ondansetron (ZOFRAN) 4 MG tablet Take 1 tablet (4 mg total) by mouth every 6 (six) hours. 11/01/22   Dartha Lodge, PA-C  oxyCODONE (OXY IR/ROXICODONE) 5 MG immediate release tablet Take 0.5 tablets (2.5 mg total) by mouth every 8 (eight) hours as needed for  moderate pain. 07/10/17   Standley Brooking, MD  pantoprazole (PROTONIX) 20 MG tablet Take 20 mg by mouth daily. 06/12/22   [provider]      Allergies    Patient has no known allergies.    Review of Systems   Review of Systems  Physical Exam Updated Vital Signs BP 124/70   Pulse 79   Temp 98.6 F (37 C)   Resp 18   Ht 5\' 2"  (1.575 m)   Wt 65.3 kg   SpO2 100%   BMI 26.34 kg/m  Physical Exam Vitals and nursing note reviewed.  Constitutional:      General: She is not in acute distress.    Appearance: She is not toxic-appearing.  HENT:     Head: Normocephalic and atraumatic.     Right Ear: Tympanic membrane normal.     Left Ear: Tympanic membrane normal.     Nose: No congestion or rhinorrhea.     Mouth/Throat:     Mouth: Mucous membranes are moist.     Pharynx: Oropharyngeal exudate and posterior oropharyngeal erythema present.     Comments: R exudate, right erythema.  Uvula is midline.  No sign of peritonsillar abscess.  Pain with palpation to neck.  No neck swelling, NO neck crepitus.  No high potato voice. Eyes:     General: No scleral icterus.    Conjunctiva/sclera: Conjunctivae normal.  Neck:     Vascular:  No carotid bruit.     Comments: R LAD Cardiovascular:     Rate and Rhythm: Normal rate and regular rhythm.     Pulses: Normal pulses.     Heart sounds: Normal heart sounds.  Pulmonary:     Effort: Pulmonary effort is normal. No respiratory distress.     Breath sounds: Normal breath sounds. No stridor. No rhonchi or rales.  Abdominal:     General: Abdomen is flat. Bowel sounds are normal.     Palpations: Abdomen is soft.     Tenderness: There is no abdominal tenderness.  Musculoskeletal:     Cervical back: No rigidity or tenderness.  Lymphadenopathy:     Cervical: Cervical adenopathy present.  Skin:    General: Skin is warm and dry.     Findings: No lesion.  Neurological:     General: No focal deficit present.     Mental Status: She is alert  and oriented to person, place, and time. Mental status is at baseline.     ED Results / Procedures / Treatments   Labs (all labs ordered are listed, but only abnormal results are displayed) Labs Reviewed  GROUP A STREP BY PCR  SARS CORONAVIRUS 2 BY RT PCR    EKG None  Radiology No results found.  Procedures Procedures    Medications Ordered in ED Medications - No data to display  ED Course/ Medical Decision Making/ A&P                                 Medical Decision Making Amount and/or Complexity of Data Reviewed Labs: ordered.  Risk Prescription drug management.   Susan Beasley 27 y.o. presented today for URI like symptoms. Working DDx that I considered at this time includes, but not limited to, viral illness, pharyngitis, mono, sinusitis, electrolyte abnormality, AOM.  R/o DDx:: these additional diagnoses are not consistent with patient's history, presentation, physical exam, labs/imaging findings.  Review of prior external notes: none  Labs:  Respiratory Panel: NEG Group A Strep: NEG Mono:NEG  Imaging:  None  Problem List / ED Course / Critical interventions / Medication management  Patient reporting with sore throat for about a week.  Patient's vitals are reassuring, does not appear ill or diaphoretic.  Lungs are clear to auscultation bilaterally.  No neck rigidity, tenderness no sign of peritonsillar abscess. Full ROM of neck and w/o pain. Pt tolerated fluids prior to d/c.  I ordered medication including Decadron  for swelling/ inflammation  Reevaluation of the patient after these medicines showed that the patient improved Patients vitals assessed. Upon arrival patient is hemodynamically stable.  I have reviewed the patients home medicines and have made adjustments as needed     Plan: Sent Augmentin, please take as prescribed.  Upon discharge, monotest had not resulted yet.  Instructed patient to continue Augmentin no matter what the result is d/t  tonsillitis F/u w/ PCP in 2-3d to ensure resolution of sx.  Patient was given return precautions. Patient stable for discharge at this time.  Patient educated on sx and dx and verbalized understanding of plan. Return to ER if new or worsening sx.          Final Clinical Impression(s) / ED Diagnoses Final diagnoses:  None    Rx / DC Orders ED Discharge Orders     None         Smitty Knudsen, PA-C 01/16/23 1552  Rozelle Logan, DO 01/17/23 [phone]

## 2023-01-16 NOTE — ED Triage Notes (Signed)
Pt to ER with c/o sore throat, ear pain and neck "swelling" for last week.  Pt states she noted "something white on my tonsil".
